# Patient Record
Sex: Female | Born: 1965
Health system: Southern US, Community
[De-identification: ages and names within clinical notes are randomized; demographics above are authoritative.]

## PROBLEM LIST (undated history)

## (undated) DIAGNOSIS — E079 Disorder of thyroid, unspecified: Secondary | ICD-10-CM

## (undated) DIAGNOSIS — Z8739 Personal history of other diseases of the musculoskeletal system and connective tissue: Secondary | ICD-10-CM

## (undated) DIAGNOSIS — Z923 Personal history of irradiation: Secondary | ICD-10-CM

## (undated) DIAGNOSIS — C4490 Unspecified malignant neoplasm of skin, unspecified: Secondary | ICD-10-CM

## (undated) DIAGNOSIS — M1990 Unspecified osteoarthritis, unspecified site: Secondary | ICD-10-CM

## (undated) DIAGNOSIS — C801 Malignant (primary) neoplasm, unspecified: Secondary | ICD-10-CM

## (undated) DIAGNOSIS — Z1379 Encounter for other screening for genetic and chromosomal anomalies: Secondary | ICD-10-CM

## (undated) DIAGNOSIS — C50919 Malignant neoplasm of unspecified site of unspecified female breast: Secondary | ICD-10-CM

## (undated) DIAGNOSIS — W19XXXA Unspecified fall, initial encounter: Secondary | ICD-10-CM

## (undated) HISTORY — DX: Disorder of thyroid, unspecified: E07.9

## (undated) HISTORY — DX: Encounter for other screening for genetic and chromosomal anomalies: Z13.79

## (undated) HISTORY — DX: Personal history of other diseases of the musculoskeletal system and connective tissue: Z87.39

## (undated) HISTORY — PX: SALPINGOOPHORECTOMY: SHX82

## (undated) HISTORY — DX: Unspecified fall, initial encounter: W19.XXXA

## (undated) HISTORY — DX: Unspecified malignant neoplasm of skin, unspecified: C44.90

## (undated) HISTORY — DX: Unspecified osteoarthritis, unspecified site: M19.90

## (undated) HISTORY — DX: Malignant (primary) neoplasm, unspecified: C80.1

---

## 1998-05-24 HISTORY — PX: CYST REMOVAL LEG: SHX6280

## 2005-12-09 ENCOUNTER — Ambulatory Visit (HOSPITAL_COMMUNITY): Admission: RE | Admit: 2005-12-09 | Discharge: 2005-12-09 | Payer: Self-pay | Admitting: Obstetrics

## 2009-07-29 ENCOUNTER — Ambulatory Visit: Payer: Self-pay | Admitting: Internal Medicine

## 2009-12-02 ENCOUNTER — Ambulatory Visit: Payer: Self-pay | Admitting: Internal Medicine

## 2010-06-14 ENCOUNTER — Encounter: Payer: Self-pay | Admitting: Internal Medicine

## 2011-01-06 ENCOUNTER — Ambulatory Visit: Payer: Self-pay | Admitting: Internal Medicine

## 2013-05-24 DIAGNOSIS — C50919 Malignant neoplasm of unspecified site of unspecified female breast: Secondary | ICD-10-CM

## 2013-05-24 HISTORY — DX: Malignant neoplasm of unspecified site of unspecified female breast: C50.919

## 2013-05-24 HISTORY — PX: BREAST BIOPSY: SHX20

## 2013-05-24 HISTORY — PX: BREAST LUMPECTOMY: SHX2

## 2013-08-22 DIAGNOSIS — C801 Malignant (primary) neoplasm, unspecified: Secondary | ICD-10-CM

## 2013-08-22 HISTORY — DX: Malignant (primary) neoplasm, unspecified: C80.1

## 2013-09-12 ENCOUNTER — Ambulatory Visit: Payer: Self-pay | Admitting: Unknown Physician Specialty

## 2013-09-17 ENCOUNTER — Other Ambulatory Visit: Payer: Self-pay | Admitting: General Surgery

## 2013-09-17 ENCOUNTER — Ambulatory Visit: Payer: Self-pay | Admitting: General Surgery

## 2013-09-17 ENCOUNTER — Ambulatory Visit (INDEPENDENT_AMBULATORY_CARE_PROVIDER_SITE_OTHER): Payer: TRICARE For Life (TFL) | Admitting: General Surgery

## 2013-09-17 ENCOUNTER — Other Ambulatory Visit: Payer: TRICARE For Life (TFL)

## 2013-09-17 ENCOUNTER — Encounter: Payer: Self-pay | Admitting: General Surgery

## 2013-09-17 VITALS — BP 110/74 | HR 76 | Resp 14 | Ht 72.0 in | Wt 170.0 lb

## 2013-09-17 DIAGNOSIS — R920 Mammographic microcalcification found on diagnostic imaging of breast: Secondary | ICD-10-CM | POA: Insufficient documentation

## 2013-09-17 DIAGNOSIS — N63 Unspecified lump in unspecified breast: Secondary | ICD-10-CM | POA: Insufficient documentation

## 2013-09-17 HISTORY — PX: BREAST SURGERY: SHX581

## 2013-09-17 NOTE — Progress Notes (Signed)
Patient ID: Susan Singleton, female   DOB: 1965-05-30, 48 y.o.   MRN: [id number]  Chief Complaint  Patient presents with  . Other    mammogram    HPI Susan Singleton is a 48 y.o. female. who presents for a breast evaluation. The most recent mammogram was done on 09/12/13 as well as a left breast ultrasound. Patient does not perform regular self breast checks. Her last mammogram prior to this one was done in 2013. She denies any problems with the breasts at this time. No known family history of breast problems. The patient is unaware of any trauma to the breast.  She is a native of [place].  The patient is accompanied by her daughter, Susan Singleton,  her only child, who was present for the interview and exam.  HPI  Past Medical History  Diagnosis Date  . Thyroid disease   . Arthritis   . H/O fibromyalgia     Past Surgical History  Procedure Laterality Date  . Cyst removal leg Right 2000  . Ovarian cyst surgery  2004    Family History  Problem Relation Age of Onset  . Cancer Mother     bladder and skin    Social History History  Substance Use Topics  . Smoking status: Former Smoker -- 1.00 packs/day for 20 years    Types: Cigarettes  . Smokeless tobacco: Never Used  . Alcohol Use: Yes    No Known Allergies  Current Outpatient Prescriptions  Medication Sig Dispense Refill  . folic acid (FOLVITE) 1 MG tablet Take 1 mg by mouth daily.      . meloxicam (MOBIC) 7.5 MG tablet Take 7.5 mg by mouth daily.      . methotrexate (RHEUMATREX) 2.5 MG tablet Take 2.5 mg by mouth once a week. Caution:Chemotherapy. Protect from light.      . predniSONE (STERAPRED UNI-PAK) 5 MG TABS tablet Take 1 mg by mouth daily.      . traMADol (ULTRAM) 50 MG tablet Take by mouth every 6 (six) hours as needed.      . vitamin E 100 UNIT capsule Take by mouth daily.      . Vitamins A & D (VITAMIN A & D) ointment Apply 1 application topically as needed for dry skin.       No current  facility-administered medications for this visit.    Review of Systems Review of Systems  Constitutional: Negative.   Respiratory: Negative.   Cardiovascular: Negative.     Blood pressure 110/74, pulse 76, resp. rate 14, height 6' (1.829 m), weight 170 lb (77.111 kg), last menstrual period 05/09/2013.  Physical Exam Physical Exam  Constitutional: She is oriented to person, place, and time. She appears well-developed and well-nourished.  Neck: Neck supple. No thyromegaly present.  Cardiovascular: Normal rate, regular rhythm and normal heart sounds.   No murmur heard. Pulmonary/Chest: Effort normal and breath sounds normal. Right breast exhibits no inverted nipple, no mass, no nipple discharge, no skin change and no tenderness. Left breast exhibits no inverted nipple, no mass, no nipple discharge, no skin change and no tenderness.    Thickening at 7 o'clock of left breast.   Lymphadenopathy:    She has no cervical adenopathy.    She has no axillary adenopathy.  Neurological: She is alert and oriented to person, place, and time.  Skin: Skin is warm and dry.    Data Reviewed Screening mammogram completed at her GYN office (August 29, 2013)showed area of calcifications  in the lower quadrant of left breast. Focal spot compression views and ultrasound dated September 12, 2013 showed a 1.0 x 1.7 x 2.30 cm area of pleomorphic calcifications in the lower outer quadrant of the breast. Ultrasound suggested an irregular hypoechoic mass 3 cm from the nipple at the 7 o'clock position measuring 1.5 x 1.6 x 1.7 cm. A normal left axilla was identified. BI-RAD-5. All these images were reviewed. GYN notes of August 29, 2013 were reviewed.  Ultrasound examination of the left breast confirmed a area of heterogeneous area with microcalcifications and acoustic shadowing in the 7 o'clock position the left breast, 2 cm of the nipple. On today's exam this measured 0.7 x 1.0 x 1.4 cm. The patient was amenable to core  biopsy.  10 cc of 0.5% Xylocaine with 0.25% Marcaine with 1:200,000 units of epinephrine was utilized well-tolerated. Approximately 10 core samples were obtained without discomfort making use of a 9-gauge Encor device. A postbiopsy clip was placed. Scant bleeding was noted. The skin defect was closed with benzoin and Steri-Strips followed by Telfa Tegaderm dressing.   Her instructions for wound care were provided.   Assessment    The left breast is suspicious for malignancy.     Plan    The patient will be contacted when her pathology is available.     Ref. MD: Dr. Vernie Ammons   PCP: None   [name] Knee 09/17/2013, 8:29 PM

## 2013-09-17 NOTE — Patient Instructions (Signed)

## 2013-09-20 ENCOUNTER — Telehealth: Payer: Self-pay | Admitting: *Deleted

## 2013-09-20 NOTE — Telephone Encounter (Signed)
I spoke with the patient and she is aware that the [facility] maybe calling to offer an appointment with the medical oncologist (she wants to wait until she talks to Dr. Bary Castilla). She will also be thinking/considering genetic testing before her appt with Dr Bary Castilla on Wednesday. Appreciates phone call.

## 2013-09-24 ENCOUNTER — Ambulatory Visit: Payer: TRICARE For Life (TFL)

## 2013-09-24 DIAGNOSIS — W19XXXA Unspecified fall, initial encounter: Secondary | ICD-10-CM

## 2013-09-24 HISTORY — DX: Unspecified fall, initial encounter: W19.XXXA

## 2013-09-26 ENCOUNTER — Encounter: Payer: Self-pay | Admitting: General Surgery

## 2013-09-26 ENCOUNTER — Ambulatory Visit (INDEPENDENT_AMBULATORY_CARE_PROVIDER_SITE_OTHER): Payer: TRICARE For Life (TFL) | Admitting: General Surgery

## 2013-09-26 VITALS — BP 140/80 | HR 72 | Resp 12 | Ht 72.0 in | Wt 172.0 lb

## 2013-09-26 DIAGNOSIS — C50319 Malignant neoplasm of lower-inner quadrant of unspecified female breast: Secondary | ICD-10-CM

## 2013-09-26 LAB — PATHOLOGY

## 2013-09-26 NOTE — Progress Notes (Signed)
Patient ID: Susan Singleton, female   DOB: 01-09-1966, 48 y.o.   MRN: [id number]  Chief Complaint  Patient presents with  . Routine Post Op    HPI Susan Singleton is a 48 y.o. female.  Here today for post op left breast biopsy 09-17-13 and discussion. States she fell 09-24-13, bruising to right shoulder, knee and thigh. The patient is accompanied by her daughter, the tourniquet was present for the interview and exam. She reports extensive bruising which is improving since biopsy. Minimal pain, only some itching. The patient discontinued her Embrel 3 weeks ago and has noted increasing fatigue. She was encouraged to resume her usual dose today, and we'll plan on discontinuing this one week prior to any surgical intervention.  HPI  Past Medical History  Diagnosis Date  . Thyroid disease   . Arthritis   . H/O fibromyalgia   . Fall 09-24-13  . Cancer April 2015    2 mm ER neg, PR neg, Her 2 neu amplified cancer with extensive DCIS    Past Surgical History  Procedure Laterality Date  . Cyst removal leg Right 2000  . Ovarian cyst surgery  2004  . Breast surgery Left 09-17-13    INVASIVE MAMMARY CARCINOMA OF NO SPECIAL TYPE,/DCIS     Family History  Problem Relation Age of Onset  . Cancer Mother     bladder and skin    Social History History  Substance Use Topics  . Smoking status: Former Smoker -- 1.00 packs/day for 20 years    Types: Cigarettes  . Smokeless tobacco: Never Used  . Alcohol Use: Yes    No Known Allergies  Current Outpatient Prescriptions  Medication Sig Dispense Refill  . folic acid (FOLVITE) 1 MG tablet Take 1 mg by mouth daily.      . meloxicam (MOBIC) 7.5 MG tablet Take 7.5 mg by mouth daily.      . methotrexate (RHEUMATREX) 2.5 MG tablet Take 2.5 mg by mouth once a week. Caution:Chemotherapy. Protect from light.      . predniSONE (STERAPRED UNI-PAK) 5 MG TABS tablet Take 1 mg by mouth daily.      . traMADol (ULTRAM) 50 MG tablet Take by mouth every 6 (six) hours  as needed.      . vitamin E 100 UNIT capsule Take by mouth daily.      . Vitamins A & D (VITAMIN A & D) ointment Apply 1 application topically as needed for dry skin.       No current facility-administered medications for this visit.    Review of Systems Review of Systems  Constitutional: Negative.   Respiratory: Negative.   Cardiovascular: Negative.     Blood pressure 140/80, pulse 72, resp. rate 12, height 6' (1.829 m), weight 172 lb (78.019 kg), last menstrual period 05/09/2013.  Physical Exam Physical Exam  Constitutional: She is oriented to person, place, and time. She appears well-developed and well-nourished.  Neck: Neck supple.  Cardiovascular: Normal rate, regular rhythm and normal heart sounds.   Pulmonary/Chest: Effort normal and breath sounds normal.  Bruising to left breast noted.  Lymphadenopathy:    She has no cervical adenopathy.  Neurological: She is alert and oriented to person, place, and time.  Skin: Skin is warm and dry.    Data Reviewed Pathology showed a 2 mm foci of invasive cancer as well as extensive DCIS, correlating with a mammographic abnormality. Unavailable at the time of the patient's appointment to do delay in transfer from  the laboratory computer to the MR, were the patient's ER/PR and HER-2/neu studies.  Assessment    Stage I carcinoma of left breast.     Plan    Options for management were reviewed. Breast conservation and mastectomy were presented as equivalent procedures. The pros and cons of surgical intervention including the need for adjuvant radiation therapy she breast conservation be chosen was discussed. The patient has a generous breast volume ( "C" cup), but the area of involvement is within 2-3 cm of the nipple, which may change the contour and sensitivity of the area. We had long discussion about the "what if" clear margins could not be obtained. She was certainly will want breast Reconstruction of mastectomy was needed, but is  not adverse to a two-step procedure as breast conservation is unsuccessful and mastectomy is required. The option for genetic testing was discussed. She is under 50, and would qualify under most insurance plans for coverage. There is no family history for breast cancer, and her age is her primary risk. After her visit it was determined that she had an ER/PR-negative tumor, but she does not meet criteria as she is HER-2 positive.  An informational brochure was provided. Opportunity for second surgical opinion, medical oncology assessment for radiation oncology assessment were reviewed. Potential benefits of early plastic surgery evaluation was discussed. She lives in [place], the opportunity to have her surgery/reconstruction completed in [place] was discussed. At this time she is comfortable being followed here in [place] and is amenable to see the [facility] based Psychiatric nurse if needed.  The patient has an appointment with her rheumatologist, Dr. Bronson Curb this coming Monday. She wants to meet with her rheumatologist prior to make any final decisions.  She'll be contacted after her case is reviewed with medical oncology. I don't think she would be a candidate for neoadjuvant chemotherapy unless completed under a protocol.  We spent approximately 1 hour at the time of today's visit, the majority related to options for management.          Forest Gleason Kionte Baumgardner 09/26/2013, 9:09 PM

## 2013-09-27 LAB — HER-2 / NEU, FISH
AVG NUM CEP17 PROBES/NUCLEUS: 1.8
Avg Num Her-2 signals/nucleus:: 12.8
HER-2/CEP17 Ratio: 7.29
NUMBER OF TUMOR CELLS COUNTED: 40
Number of Observers:: 2

## 2013-09-27 LAB — ER/PR,IMMUNOHISTOCHEM,PARAFFIN
Estrogen Receptor IHC: 0 %
PROGESTERONE RECP IP: 0 %

## 2013-10-01 ENCOUNTER — Telehealth: Payer: Self-pay | Admitting: General Surgery

## 2013-10-01 NOTE — Telephone Encounter (Signed)
PATIENT CALLED TO LET DR Bary Castilla KNOW SHE HAD DECIDED TO HAVE A LUMPECTOMY W/ RADIATION AND GENETIC TESTING.HE HAS BEEN NOTIFIED.SHE WOULD LIKE FOR YOU TO CALL HER.

## 2013-10-01 NOTE — Telephone Encounter (Signed)
10-01-13 PATIENT CALLED AND HAS DECIDED TO HAVE A LUMPECTOMY W/ RADIATION AND GENETIC TESTING.SHE WANTED TO KNOW WHERE TO GO FROM HERE.

## 2013-10-01 NOTE — Telephone Encounter (Signed)
Will arrange for  BRCA testing at a minimum based on her age of diagnosis. Will work on scheduling wide excision and SLN biopsy when convenient.  Susan Singleton: Please arrange for BRCA testing.

## 2013-10-02 ENCOUNTER — Other Ambulatory Visit: Payer: Self-pay | Admitting: *Deleted

## 2013-10-02 ENCOUNTER — Telehealth: Payer: Self-pay

## 2013-10-02 NOTE — Telephone Encounter (Signed)
Discussed BRCA testing to be completed, pt agrees. Age at [place] 38. She will check into family history of breast/ovarian cancer as well.

## 2013-10-02 NOTE — Telephone Encounter (Signed)
Spoke with patient about surgery. Patient has been scheduled for surgery at [facility] on 10/18/13. She will pre admit by phone on 10/08/13. Patient is on Enbrel injections and per Rheumatologists's recommendations is stopping her Enbrel today. Dr. Dossie Der [facility] to fax instruction letter to Dr Bary Castilla about this. Patient is aware of date and all instructions. Surgery instruction sheet has been mailed to the patient.

## 2013-10-04 ENCOUNTER — Ambulatory Visit (INDEPENDENT_AMBULATORY_CARE_PROVIDER_SITE_OTHER): Payer: TRICARE For Life (TFL) | Admitting: *Deleted

## 2013-10-04 ENCOUNTER — Encounter: Payer: Self-pay | Admitting: *Deleted

## 2013-10-04 DIAGNOSIS — Z1371 Encounter for nonprocreative screening for genetic disease carrier status: Secondary | ICD-10-CM

## 2013-10-04 DIAGNOSIS — C50319 Malignant neoplasm of lower-inner quadrant of unspecified female breast: Secondary | ICD-10-CM

## 2013-10-04 HISTORY — DX: Encounter for nonprocreative screening for genetic disease carrier status: Z13.71

## 2013-10-04 NOTE — Progress Notes (Signed)
Genetic test completed BRCA.

## 2013-10-04 NOTE — Patient Instructions (Signed)
As scheduled

## 2013-10-07 ENCOUNTER — Other Ambulatory Visit: Payer: Self-pay | Admitting: General Surgery

## 2013-10-07 ENCOUNTER — Telehealth: Payer: Self-pay | Admitting: General Surgery

## 2013-10-07 DIAGNOSIS — C50319 Malignant neoplasm of lower-inner quadrant of unspecified female breast: Secondary | ICD-10-CM

## 2013-10-07 NOTE — Telephone Encounter (Signed)
Reviewed plans for upcoming breast conservation surgery. Indications for SLN biopsy, difficulty of identifying margins w/ DCIS reviewed. Role for med onc involvement after all data available to address the 2 mm area of ER/PR neg,  Her2_+ tumor reviewed.  Post surgery radiation discussed.  Ready for May 28 OR date.

## 2013-10-18 ENCOUNTER — Ambulatory Visit: Payer: Self-pay | Admitting: General Surgery

## 2013-10-18 ENCOUNTER — Encounter: Payer: Self-pay | Admitting: General Surgery

## 2013-10-18 DIAGNOSIS — C50319 Malignant neoplasm of lower-inner quadrant of unspecified female breast: Secondary | ICD-10-CM

## 2013-10-18 HISTORY — PX: BREAST SURGERY: SHX581

## 2013-10-19 ENCOUNTER — Encounter: Payer: Self-pay | Admitting: General Surgery

## 2013-10-22 ENCOUNTER — Telehealth: Payer: Self-pay | Admitting: General Surgery

## 2013-10-22 LAB — PATHOLOGY REPORT

## 2013-10-22 NOTE — Telephone Encounter (Signed)
Notified path results were fine: Negative margins, no residual invasive cancer, negative SLN.

## 2013-10-23 ENCOUNTER — Encounter: Payer: Self-pay | Admitting: General Surgery

## 2013-10-23 ENCOUNTER — Ambulatory Visit (INDEPENDENT_AMBULATORY_CARE_PROVIDER_SITE_OTHER): Payer: TRICARE For Life (TFL) | Admitting: General Surgery

## 2013-10-23 VITALS — BP 118/80 | HR 80 | Resp 14 | Ht 72.0 in | Wt 171.0 lb

## 2013-10-23 DIAGNOSIS — C50319 Malignant neoplasm of lower-inner quadrant of unspecified female breast: Secondary | ICD-10-CM

## 2013-10-23 NOTE — Patient Instructions (Addendum)
Patient to been taking to the  Medical oncologist.  Patient is scheduled to see Dr Noreene Filbert on 10/26/13 at 11:00 am for consultation. Patient is aware of date and time.

## 2013-10-23 NOTE — Progress Notes (Signed)
Patient ID: Susan Singleton, female   DOB: February 29, 1966, 48 y.o.   MRN: [id number]  Chief Complaint  Patient presents with  . Routine Post Op    left breast excision    HPI Susan Singleton is a 48 y.o. female. here today for her post op left breast excision done on 10/18/13. Patient states she is doing well. Patient states she has a broken arm found out on 10-19-13. The patient has experienced minimal pain since surgery. She was glad to have the compressive wrap off earlier this week.  HPI  Past Medical History  Diagnosis Date  . Thyroid disease   . Arthritis   . H/O fibromyalgia   . Fall 09-24-13  . Cancer April 2015    2 mm ER neg, PR neg, Her 2 neu amplified cancer with extensive DCIS, T1a,N0,M0.    Past Surgical History  Procedure Laterality Date  . Cyst removal leg Right 2000  . Ovarian cyst surgery  2004  . Breast surgery Left 09-17-13    INVASIVE MAMMARY CARCINOMA OF NO SPECIAL TYPE,/DCIS   . Breast surgery Left 10/18/13    Wide excision, mastoplasty. Sentinel node biopsy.    Family History  Problem Relation Age of Onset  . Cancer Mother 39    bladder and skin    Social History History  Substance Use Topics  . Smoking status: Former Smoker -- 1.00 packs/day for 20 years    Types: Cigarettes  . Smokeless tobacco: Never Used  . Alcohol Use: Yes    No Known Allergies  Current Outpatient Prescriptions  Medication Sig Dispense Refill  . etanercept (ENBREL) 25 MG injection Inject 50 mg into the skin once a week.      . folic acid (FOLVITE) 1 MG tablet Take 1 mg by mouth daily.      . meloxicam (MOBIC) 7.5 MG tablet Take 7.5 mg by mouth daily.      . methotrexate (RHEUMATREX) 2.5 MG tablet Take 2.5 mg by mouth once a week. Caution:Chemotherapy. Protect from light.      . predniSONE (STERAPRED UNI-PAK) 5 MG TABS tablet Take 1 mg by mouth daily.      . traMADol (ULTRAM) 50 MG tablet Take by mouth every 6 (six) hours as needed.      . vitamin E 100 UNIT capsule Take by mouth  daily.      . Vitamins A & D (VITAMIN A & D) ointment Apply 1 application topically as needed for dry skin.       No current facility-administered medications for this visit.    Review of Systems Review of Systems  Constitutional: Negative.   Respiratory: Negative.     Blood pressure 118/80, pulse 80, resp. rate 14, height 6' (1.829 m), weight 171 lb (77.565 kg).  Physical Exam Physical Exam  Constitutional: She is oriented to person, place, and time. She appears well-developed and well-nourished.  Eyes: Conjunctivae are normal.  Neck: Neck supple.  Pulmonary/Chest:  Left breast incision looks clean and healing well.   Neurological: She is alert and oriented to person, place, and time.  Skin: Skin is warm and dry.    Data Reviewed Pathology showed no residual invasive cancer. The DCIS did come to within less than a millimeter of the cranial margin.  Assessment    T1a, N0, carcinoma of the left breast. ER/PR negative, HER-2/neu overexpression.     Plan    The patient has a very small HER-2/neu positive tumor. Input for medical  oncology will be obtained. Radiation oncology appointment has been scheduled for later this week. I think that she will be best served by whole breast radiation.  At this time she'll resume her methotrexate weekly dosing beginning on October 24, 2013. She will continue off of her Enbrel until instructed otherwise by her medical oncologist. She'll wean off the Vicodin and resume her normal dose of Ultram for control of her chronic fibromyalgia.     Patient is scheduled to see Dr Noreene Filbert on 10/26/13 at 11:00 am for consultation. Patient is aware of date and time.   PCP: No Pcp Per Patient Ref. MD: Dr. Ricka Burdock 10/23/2013, 8:28 PM

## 2013-10-25 ENCOUNTER — Telehealth: Payer: Self-pay | Admitting: *Deleted

## 2013-10-25 NOTE — Telephone Encounter (Signed)
Message copied by Carson Myrtle on Thu Oct 25, 2013  5:17 PM ------      Message from: [place], Forest Gleason      Created: Thu Oct 25, 2013  3:27 PM       Please notify the patient that I have no additional information at present.  Our weekly meeting where I planned to present her case was cancelled secondary to the JCAHO inspection. Will keep her informed when the meeting is rescheduled. Thanks. ------

## 2013-10-25 NOTE — Telephone Encounter (Signed)
Notified patient as instructed, patient pleased. Discussed follow-up appointments, patient agrees  

## 2013-10-26 ENCOUNTER — Ambulatory Visit: Payer: Self-pay | Admitting: Radiation Oncology

## 2013-11-06 ENCOUNTER — Encounter: Payer: Self-pay | Admitting: General Surgery

## 2013-11-06 ENCOUNTER — Ambulatory Visit (INDEPENDENT_AMBULATORY_CARE_PROVIDER_SITE_OTHER): Payer: TRICARE For Life (TFL) | Admitting: General Surgery

## 2013-11-06 VITALS — BP 122/74 | HR 76 | Resp 12 | Ht 72.0 in | Wt 170.0 lb

## 2013-11-06 DIAGNOSIS — C50319 Malignant neoplasm of lower-inner quadrant of unspecified female breast: Secondary | ICD-10-CM

## 2013-11-06 NOTE — Patient Instructions (Addendum)
Patient to return in 2 months. 

## 2013-11-06 NOTE — Progress Notes (Signed)
Patient ID: Susan Singleton, female   DOB: Jan 22, 1966, 48 y.o.   MRN: [id number]  Chief Complaint  Patient presents with  . Routine Post Op    left breast wide excision    HPI Susan Singleton is a 48 y.o. female here today for her post op left breast excision done on 10/18/13. Patient states she is doing well.                   HPI  Past Medical History  Diagnosis Date  . Thyroid disease   . Arthritis   . H/O fibromyalgia   . Fall 09-24-13  . Cancer April 2015    2 mm ER neg, PR neg, Her 2 neu amplified cancer with extensive DCIS, T1a,N0,M0.    Past Surgical History  Procedure Laterality Date  . Cyst removal leg Right 2000  . Ovarian cyst surgery  2004  . Breast surgery Left 09-17-13    INVASIVE MAMMARY CARCINOMA OF NO SPECIAL TYPE,/DCIS   . Breast surgery Left 10/18/13    Wide excision, mastoplasty. Sentinel node biopsy.    Family History  Problem Relation Age of Onset  . Cancer Mother 63    bladder and skin    Social History History  Substance Use Topics  . Smoking status: Former Smoker -- 1.00 packs/day for 20 years    Types: Cigarettes  . Smokeless tobacco: Never Used  . Alcohol Use: Yes    No Known Allergies  Current Outpatient Prescriptions  Medication Sig Dispense Refill  . etanercept (ENBREL) 25 MG injection Inject 50 mg into the skin once a week.      . folic acid (FOLVITE) 1 MG tablet Take 1 mg by mouth daily.      . meloxicam (MOBIC) 7.5 MG tablet Take 7.5 mg by mouth daily.      . methotrexate (RHEUMATREX) 2.5 MG tablet Take 2.5 mg by mouth once a week. Caution:Chemotherapy. Protect from light.      . predniSONE (STERAPRED UNI-PAK) 5 MG TABS tablet Take 1 mg by mouth daily.      . traMADol (ULTRAM) 50 MG tablet Take by mouth every 6 (six) hours as needed.      . vitamin E 100 UNIT capsule Take by mouth daily.      . Vitamins A & D (VITAMIN A & D) ointment Apply 1 application topically as needed for dry skin.       No current facility-administered  medications for this visit.    Review of Systems Review of Systems  Constitutional: Negative.   Respiratory: Negative.   Cardiovascular: Negative.     Blood pressure 122/74, pulse 76, resp. rate 12, height 6' (1.829 m), weight 170 lb (77.111 kg).  Physical Exam Physical Exam  Constitutional: She is oriented to person, place, and time. She appears well-developed and well-nourished.  Eyes: Conjunctivae are normal. No scleral icterus.  Neck: Neck supple.  Cardiovascular: Normal rate, regular rhythm and normal heart sounds.   Pulmonary/Chest: Effort normal and breath sounds normal. Right breast exhibits no inverted nipple, no mass, no nipple discharge, no skin change and no tenderness. Left breast exhibits no inverted nipple, no mass, no nipple discharge, no skin change and no tenderness.  Left lower quadrant and axillary is healing well. Incision look clean .  Abdominal: Bowel sounds are normal.  Neurological: She is alert and oriented to person, place, and time.  Skin: Skin is warm and dry.    Data Reviewed BRCA test  results still pending.  Assessment    Doing well status post wide excision and sentinel node biopsy.     Plan    The patient's case was reviewed at the [facility] Tumor board yesterday. No indication for adjuvant chemotherapy. Whole breast radiation plan due to the extensive DCIS and the close (less than 1 cm) superior margin.  We'll plan for a follow up examination in 2 months.     PCP: No Pcp Per Patient  Ref. MD: Dr. Percell Boston, Forest Gleason 11/06/2013, 10:05 PM

## 2013-11-07 ENCOUNTER — Telehealth: Payer: Self-pay | Admitting: *Deleted

## 2013-11-07 ENCOUNTER — Encounter: Payer: Self-pay | Admitting: *Deleted

## 2013-11-07 NOTE — Telephone Encounter (Signed)
Please let the patient know her genetic testing was negative per Dr. Bary Castilla, she should receive her copy in mail. Also, let her know that Dr. Oliva Bustard will be talking with her physician about the Enbrel injections and will be calling her. She said they called her today, she has appt with Dr Baruch Gouty this afternoon and said they would let her know then when to start the injections back. She is pleased that the genetic testing was negative.

## 2013-11-07 NOTE — Telephone Encounter (Signed)
Message copied by Carson Myrtle on Wed Nov 07, 2013 11:37 AM ------      Message from: Robert Bellow      Created: Tue Nov 06, 2013 10:04 PM       See if you can track  down the BRCA test results. It has been a month. ------

## 2013-11-07 NOTE — Telephone Encounter (Signed)
Per Loui at [facility] he states results were mailed 11-18-13, he will fax results as well.

## 2013-11-13 ENCOUNTER — Encounter: Payer: Self-pay | Admitting: General Surgery

## 2013-11-15 LAB — CBC CANCER CENTER
Basophil #: 0.1 x10 3/mm (ref 0.0–0.1)
Basophil %: 1.2 %
EOS ABS: 0 x10 3/mm (ref 0.0–0.7)
Eosinophil %: 0.7 %
HCT: 43.3 % (ref 35.0–47.0)
HGB: 14.7 g/dL (ref 12.0–16.0)
Lymphocyte #: 1.3 x10 3/mm (ref 1.0–3.6)
Lymphocyte %: 21.5 %
MCH: 34.4 pg — ABNORMAL HIGH (ref 26.0–34.0)
MCHC: 33.8 g/dL (ref 32.0–36.0)
MCV: 102 fL — ABNORMAL HIGH (ref 80–100)
MONOS PCT: 12.6 %
Monocyte #: 0.8 x10 3/mm (ref 0.2–0.9)
NEUTROS ABS: 3.9 x10 3/mm (ref 1.4–6.5)
Neutrophil %: 64 %
Platelet: 202 x10 3/mm (ref 150–440)
RBC: 4.26 10*6/uL (ref 3.80–5.20)
RDW: 13.1 % (ref 11.5–14.5)
WBC: 6.1 x10 3/mm (ref 3.6–11.0)

## 2013-11-21 ENCOUNTER — Ambulatory Visit: Payer: Self-pay | Admitting: Radiation Oncology

## 2013-11-22 ENCOUNTER — Ambulatory Visit: Payer: Self-pay | Admitting: Rheumatology

## 2013-11-22 LAB — CBC CANCER CENTER
Basophil #: 0.1 x10 3/mm (ref 0.0–0.1)
Basophil %: 1.9 %
EOS PCT: 1.5 %
Eosinophil #: 0.1 x10 3/mm (ref 0.0–0.7)
HCT: 43.5 % (ref 35.0–47.0)
HGB: 14.5 g/dL (ref 12.0–16.0)
LYMPHS ABS: 1.5 x10 3/mm (ref 1.0–3.6)
Lymphocyte %: 25.8 %
MCH: 33.8 pg (ref 26.0–34.0)
MCHC: 33.4 g/dL (ref 32.0–36.0)
MCV: 101 fL — AB (ref 80–100)
MONO ABS: 0.7 x10 3/mm (ref 0.2–0.9)
MONOS PCT: 12.5 %
Neutrophil #: 3.5 x10 3/mm (ref 1.4–6.5)
Neutrophil %: 58.3 %
PLATELETS: 210 x10 3/mm (ref 150–440)
RBC: 4.3 10*6/uL (ref 3.80–5.20)
RDW: 12.6 % (ref 11.5–14.5)
WBC: 5.9 x10 3/mm (ref 3.6–11.0)

## 2013-11-22 LAB — COMPREHENSIVE METABOLIC PANEL
ANION GAP: 5 — AB (ref 7–16)
AST: 24 U/L (ref 15–37)
Albumin: 3.7 g/dL (ref 3.4–5.0)
Alkaline Phosphatase: 82 U/L
BUN: 19 mg/dL — AB (ref 7–18)
Bilirubin,Total: 1.3 mg/dL — ABNORMAL HIGH (ref 0.2–1.0)
CALCIUM: 8.7 mg/dL (ref 8.5–10.1)
CHLORIDE: 103 mmol/L (ref 98–107)
Co2: 31 mmol/L (ref 21–32)
Creatinine: 0.64 mg/dL (ref 0.60–1.30)
Glucose: 81 mg/dL (ref 65–99)
Osmolality: 279 (ref 275–301)
Potassium: 4.2 mmol/L (ref 3.5–5.1)
SGPT (ALT): 22 U/L (ref 12–78)
Sodium: 139 mmol/L (ref 136–145)
Total Protein: 7.6 g/dL (ref 6.4–8.2)

## 2013-11-30 LAB — CBC CANCER CENTER
Basophil #: 0.1 x10 3/mm (ref 0.0–0.1)
Basophil %: 1.2 %
EOS ABS: 0.1 x10 3/mm (ref 0.0–0.7)
EOS PCT: 1.1 %
HCT: 46.5 % (ref 35.0–47.0)
HGB: 15.8 g/dL (ref 12.0–16.0)
Lymphocyte #: 1.7 x10 3/mm (ref 1.0–3.6)
Lymphocyte %: 22.2 %
MCH: 34.2 pg — ABNORMAL HIGH (ref 26.0–34.0)
MCHC: 34 g/dL (ref 32.0–36.0)
MCV: 101 fL — ABNORMAL HIGH (ref 80–100)
MONO ABS: 0.7 x10 3/mm (ref 0.2–0.9)
MONOS PCT: 8.8 %
NEUTROS PCT: 66.7 %
Neutrophil #: 5 x10 3/mm (ref 1.4–6.5)
PLATELETS: 192 x10 3/mm (ref 150–440)
RBC: 4.62 10*6/uL (ref 3.80–5.20)
RDW: 12.5 % (ref 11.5–14.5)
WBC: 7.5 x10 3/mm (ref 3.6–11.0)

## 2013-12-06 LAB — CBC CANCER CENTER
BASOS ABS: 0 x10 3/mm (ref 0.0–0.1)
BASOS PCT: 0.2 %
EOS ABS: 0.1 x10 3/mm (ref 0.0–0.7)
Eosinophil %: 1.4 %
HCT: 43.3 % (ref 35.0–47.0)
HGB: 14.7 g/dL (ref 12.0–16.0)
LYMPHS PCT: 20.3 %
Lymphocyte #: 1.3 x10 3/mm (ref 1.0–3.6)
MCH: 34.2 pg — ABNORMAL HIGH (ref 26.0–34.0)
MCHC: 34 g/dL (ref 32.0–36.0)
MCV: 101 fL — ABNORMAL HIGH (ref 80–100)
MONOS PCT: 12.4 %
Monocyte #: 0.8 x10 3/mm (ref 0.2–0.9)
NEUTROS PCT: 65.7 %
Neutrophil #: 4.3 x10 3/mm (ref 1.4–6.5)
PLATELETS: 184 x10 3/mm (ref 150–440)
RBC: 4.3 10*6/uL (ref 3.80–5.20)
RDW: 12.3 % (ref 11.5–14.5)
WBC: 6.5 x10 3/mm (ref 3.6–11.0)

## 2013-12-13 LAB — CBC CANCER CENTER
BASOS ABS: 0.1 x10 3/mm (ref 0.0–0.1)
Basophil %: 1.3 %
EOS PCT: 1.7 %
Eosinophil #: 0.1 x10 3/mm (ref 0.0–0.7)
HCT: 41.7 % (ref 35.0–47.0)
HGB: 14.2 g/dL (ref 12.0–16.0)
LYMPHS ABS: 1.2 x10 3/mm (ref 1.0–3.6)
Lymphocyte %: 19.5 %
MCH: 34.3 pg — AB (ref 26.0–34.0)
MCHC: 34.1 g/dL (ref 32.0–36.0)
MCV: 101 fL — ABNORMAL HIGH (ref 80–100)
Monocyte #: 0.7 x10 3/mm (ref 0.2–0.9)
Monocyte %: 11.7 %
Neutrophil #: 3.9 x10 3/mm (ref 1.4–6.5)
Neutrophil %: 65.8 %
Platelet: 187 x10 3/mm (ref 150–440)
RBC: 4.14 10*6/uL (ref 3.80–5.20)
RDW: 12.6 % (ref 11.5–14.5)
WBC: 5.9 x10 3/mm (ref 3.6–11.0)

## 2013-12-20 LAB — CBC CANCER CENTER
BASOS PCT: 1.4 %
Basophil #: 0.1 x10 3/mm (ref 0.0–0.1)
Eosinophil #: 0.1 x10 3/mm (ref 0.0–0.7)
Eosinophil %: 0.9 %
HCT: 43.1 % (ref 35.0–47.0)
HGB: 14.8 g/dL (ref 12.0–16.0)
LYMPHS PCT: 21.5 %
Lymphocyte #: 1.3 x10 3/mm (ref 1.0–3.6)
MCH: 34.2 pg — ABNORMAL HIGH (ref 26.0–34.0)
MCHC: 34.2 g/dL (ref 32.0–36.0)
MCV: 100 fL (ref 80–100)
MONO ABS: 0.7 x10 3/mm (ref 0.2–0.9)
Monocyte %: 11.9 %
NEUTROS PCT: 64.3 %
Neutrophil #: 4 x10 3/mm (ref 1.4–6.5)
PLATELETS: 204 x10 3/mm (ref 150–440)
RBC: 4.32 10*6/uL (ref 3.80–5.20)
RDW: 12.3 % (ref 11.5–14.5)
WBC: 6.2 x10 3/mm (ref 3.6–11.0)

## 2013-12-22 ENCOUNTER — Ambulatory Visit: Payer: Self-pay | Admitting: Radiation Oncology

## 2014-01-08 ENCOUNTER — Encounter: Payer: Self-pay | Admitting: General Surgery

## 2014-01-08 ENCOUNTER — Ambulatory Visit (INDEPENDENT_AMBULATORY_CARE_PROVIDER_SITE_OTHER): Payer: Self-pay | Admitting: General Surgery

## 2014-01-08 VITALS — BP 100/70 | HR 72 | Resp 16 | Ht 72.0 in | Wt 170.0 lb

## 2014-01-08 DIAGNOSIS — C50319 Malignant neoplasm of lower-inner quadrant of unspecified female breast: Secondary | ICD-10-CM

## 2014-01-08 DIAGNOSIS — C50312 Malignant neoplasm of lower-inner quadrant of left female breast: Secondary | ICD-10-CM

## 2014-01-08 NOTE — Patient Instructions (Addendum)
Continue self breast exams. Call office for any new breast issues or concerns. April 2016 bilateral diagnostic mammograms and office visit.

## 2014-01-08 NOTE — Progress Notes (Signed)
Patient ID: Susan Singleton, female   DOB: 11-30-1965, 48 y.o.   MRN: [id number]  Chief Complaint  Patient presents with  . Follow-up    left breast excision    HPI Susan Singleton is a 48 y.o. female here today for her two month follow up left breast wide excision done on 10/18/13. Patient state she is doing well. No new problems at this time.   She is using a right shoulder fracture using bone stimulator to see if that helps, followed by [employer]. Followed by Dr. Dossie Der [facility] regarding her RA.   Completed radiation 12-28-13. No new breast issues.  HPI  Past Medical History  Diagnosis Date  . Thyroid disease   . Arthritis   . H/O fibromyalgia   . Fall 09-24-13  . 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    Past Surgical History  Procedure Laterality Date  . Cyst removal leg Right 2000  . Ovarian cyst surgery  2004  . Breast surgery Left 09-17-13    INVASIVE MAMMARY CARCINOMA OF NO SPECIAL TYPE,/DCIS   . Breast surgery Left 10/18/13    Wide excision, mastoplasty. Sentinel node biopsy.    Family History  Problem Relation Age of Onset  . Cancer Mother 36    bladder and skin    Social History History  Substance Use Topics  . Smoking status: Former Smoker -- 1.00 packs/day for 20 years    Types: Cigarettes  . Smokeless tobacco: Never Used  . Alcohol Use: Yes    No Known Allergies  Current Outpatient Prescriptions  Medication Sig Dispense Refill  . folic acid (FOLVITE) 1 MG tablet Take 1 mg by mouth daily.      . meloxicam (MOBIC) 7.5 MG tablet Take 7.5 mg by mouth daily.      . methotrexate (RHEUMATREX) 2.5 MG tablet Take 2.5 mg by mouth once a week. Caution:Chemotherapy. Protect from light.      . predniSONE (STERAPRED UNI-PAK) 5 MG TABS tablet Take 1 mg by mouth daily.      . traMADol (ULTRAM) 50 MG tablet Take by mouth every 6 (six) hours as needed.      . vitamin E 100  UNIT capsule Take by mouth daily.      . Vitamins A & D (VITAMIN A & D) ointment Apply 1 application topically as needed for dry skin.       No current facility-administered medications for this visit.    Review of Systems Review of Systems  Constitutional: Negative.   Respiratory: Negative.   Cardiovascular: Negative.     Blood pressure 100/70, pulse 72, resp. rate 16, height 6' (1.829 m), weight 170 lb (77.111 kg), last menstrual period 07/01/2013.  Physical Exam Physical Exam  Constitutional: She is oriented to person, place, and time. She appears well-developed and well-nourished.  Neck: Neck supple.  Cardiovascular: Normal rate, regular rhythm and normal heart sounds.   Pulmonary/Chest: Effort normal and breath sounds normal.  Small amount erythremia and Superficial loss at 8 o'clock left breast  Lymphadenopathy:    She has no cervical adenopathy.  Neurological: She is alert and oriented to person, place, and time.  Skin: Skin is warm and dry.    Data Reviewed New new data.   Assessment    Minimal skin changes status post whole breast radiation with a boost to the left lower quadrant.  Increasing RA  symptoms off Enbrel.     Plan    The patient is scheduled to meet with her rheumatologist for the next month, and hopefully a suitable biologic without unacceptable immunosuppression can be identified. There will always be a trade off with increasing risk for new or recurrent cancers versus lifestyle possibilities by minimizing RA symptoms.  The patient was not thought to be a candidate4 adjuvant chemotherapy in spite of her HER-2 positive tumor because of the 2 mm size.  We'll plan for a follow up examination in spring 2016 with bilateral mammograms at that time.  April 2016 bilateral diagnostic mammograms and office visit.     Maryruth Eve, MD; [facility]; [facility]  Ref. MD: Dr. Vernie Ammons  Dr. Noreene Filbert   Robert Bellow 01/11/2014, 3:46 PM

## 2014-01-11 ENCOUNTER — Encounter: Payer: Self-pay | Admitting: General Surgery

## 2014-01-22 ENCOUNTER — Ambulatory Visit: Payer: Self-pay | Admitting: Radiation Oncology

## 2014-03-25 ENCOUNTER — Encounter: Payer: Self-pay | Admitting: General Surgery

## 2014-05-07 ENCOUNTER — Ambulatory Visit: Payer: Self-pay | Admitting: Oncology

## 2014-05-24 ENCOUNTER — Ambulatory Visit: Payer: Self-pay | Admitting: Oncology

## 2014-07-05 ENCOUNTER — Ambulatory Visit: Payer: Self-pay | Admitting: Radiation Oncology

## 2014-09-10 ENCOUNTER — Ambulatory Visit: Payer: TRICARE For Life (TFL) | Admitting: General Surgery

## 2014-09-10 ENCOUNTER — Ambulatory Visit
Admit: 2014-09-10 | Disposition: A | Discharge status: Home / Self Care | Payer: Self-pay | Attending: General Surgery | Admitting: General Surgery

## 2014-09-14 NOTE — Consult Note (Signed)
Reason for Visit: This 49 year old Female patient presents to the clinic for initial evaluation of  breast cancer .   Referred by Dr. Bary Castilla.  Diagnosis:  Chief Complaint/Diagnosis   49 year old female with stage I invasive mammary carcinoma ER/PR negative HER-2/neu overexpressed 2 mm focus of invasive disease sentinel node negative pathologic stage I (T1 A. N0 M0) for adjuvant whole breast radiation  Pathology Report pathology report reviewed   Imaging Report mammograms reviewed   Referral Report clinical notes reviewed   Planned Treatment Regimen whole breast radiation   HPI   patient is a 49 year old female who presents with an abnormal mammogram of the left breast showing a dense area of calcifications in the lower inner quadrant.this was confirmed on ultrasound. She underwent under stereotactic assisted biopsy which was positive for ductal carcinoma in situ with an 2 mm focus of invasive mammary carcinoma ER/PR negative HER-2/neu overexpressed. She will have a wide local excision. No residual invasive mammary carcinoma was seen. There was extensive DCIS component margins were clear at less than 1 mm for DCIS. 2 sentinel lymph nodes were negative. Case was discussed with medical oncology and not thought to be a candidate for systemic treatment. I discussed the case with surgical oncology personally. Patient is doing well at this time although she did fall and a fracture of her right humerus which is being addressed with an MRI scan next week. She specifically denies breast tenderness cough or bone pain.  Past Hx:    Right Bundle Branch Block:    Rheumatoid Arthritis:    Hyperthyroidism: no meds   Cancer, Breast: left   foot surgery:    miscarriage:    salpingoophorectomy:    right leg cyst removal:    Left breast bx: May 2015  Past, Family and Social History:  Past Medical History positive   Cardiovascular right bundle branch block   Endocrine hyperthyroidism    Past Surgical History salpingo-oophorectomy, miscarriage, foot surgery, right leg cyst removed   Past Medical History Comments rheumatoid arthritis, fibromyalgia   Family History positive   Social History positive   Social History Comments 20 pack year smoking history no EtOH abuse history   Additional Past Medical and Surgical History seen by herself tonight   Allergies:   Percocet 10/325: Do NOT Use  Home Meds:  Home Medications: Medication Instructions Status  acetaminophen-HYDROcodone 325 mg-5 mg oral tablet 1 tab(s) orally every 4 to 6 hours, As needed, pain Active  folic acid 1 mg oral tablet 1 tab(s) orally once a day Active  meloxicam 7.5 mg oral tablet 1 tab(s) orally once a day Active  methotrexate 2.5 mg oral tablet 4  orally 2 times a day Active  predniSONE 5 mg oral tablet 1 tab(s) orally once a day Active  traMADol 50 mg oral tablet 1 tab(s) orally 3 times a day  Active  vitamin E 100 intl units oral capsule 1 cap(s) orally once a day Active  Vitamin D3 1000 intl units oral capsule 1 cap(s) orally once a day Active  Vitamin C 500 mg oral tablet 1 tab(s) orally once a day Active  biotin 2.5 mg oral tablet 1 tab(s) orally once a day Active  B Complex 50 1 tab(s) orally once a day Active  minocycline 50 mg oral tablet 1 tab(s) orally 2 times a day Active   Review of Systems:  General negative   Performance Status (ECOG) 0   Skin negative   Breast negative   Ophthalmologic  negative   ENMT negative   Respiratory and Thorax negative   Cardiovascular negative   Gastrointestinal negative   Genitourinary negative   Musculoskeletal negative   Neurological negative   Hematology/Lymphatics negative   Endocrine negative   Allergic/Immunologic negative   Review of Systems   review of systems obtained from nurses notes  Nursing Notes:  Nursing Vital Signs and Chemo Nursing Nursing Notes: *CC Vital Signs Flowsheet:   05-Jun-15 11:17  Temp  Temperature 96.8  Pulse Pulse 89  Respirations Respirations 20  SBP SBP 171  DBP DBP 73  Pain Scale (0-10)  0  Current Weight (kg) (kg) 76.3   Physical Exam:  General/Skin/HEENT:  General normal   Skin normal   Eyes normal   ENMT normal   Head and Neck normal   Additional PE well-developed well-nourished female in NAD. She has significant tenting of the skin. Lungs are clear to A&P cardiac examination shows regular rate and rhythm. Left breast is wide local excision scar which is healing well. No dominant mass or nodularity is noted in either breast in 2 positions examined. Lungs are clear to A&P. No axillary or supraclavicular adenopathy is identified. She's quite sensitive to touch of the right upper extremity secondary to recent traumatic fracture.   Breasts/Resp/CV/GI/GU:  Respiratory and Thorax normal   Cardiovascular normal   Gastrointestinal normal   Genitourinary normal   MS/Neuro/Psych/Lymph:  Musculoskeletal normal   Neurological normal   Lymphatics normal   LAB Results:  Laboratory Results: Pathology:  28-May-15 00:12   Pathology Report ========== TEST NAME ==========  ========= RESULTS =========  = REFERENCE RANGE =  PATHOLOGY REPORT  Pathology Report .                               [   Final Report         ]                   Material submitted:         Marland Kitchen PART A: SENTINEL NODE #1 PART B: LEFT BREAST 8:00 WIDE EXCISION .                               [   Final Report         ]                   Pre-operative diagnosis:                                        . DCIS LEFT BREAST WIDE EXCISION LEFT BREAST 8:00 .                               [   Final Report         ]                   Frozen section diagnosis:                                       . FSA1/A2. SENTINEL LYMPH NODE #1, EXCISION:          -  2 SENTINEL LYMPH NODES NEGATIVE FOR            MACROMETASTASIS.          - CALLED TO DR. BYRNETT AT 1:34 PM ON 10/18/13. IOCB.    LEFT BREAST,  WIDE EXCISION:          - MARGINS GROSSLY CLEAR.          - CALLED TO DR. BYRNETT AT 1:39 PM ON 10/18/13. . DR. DANA BAKER /KCT .                              [   Final Report         ]                   ********************************************************************** Diagnosis: Part A: SENTINEL NODE #1, EXCISION: - NO TUMOR SEEN IN TWO SENTINEL LYMPH NODES (0/2). . Part B: LEFT BREAST 8:00 WIDE EXCISION: - INVASIVE MAMMARY CARCINOMA, NO SPECIAL TYPE IN A PREVIOUS SPECIMEN, SEE SUMMARY BELOW. - DUCTAL CARCINOMA IN SITU, NUCLEAR GRADE 3, COMEDO TYPE WITH MICROCALCIFICATIONS AND EXTENSIVE INTRADUCTAL COMPONENT. - BIOPSY SITE CHANGES, MARKER CLIP PRESENT. Marland Kitchen CANCER CASE SUMMARY: INVASIVE CARCINOMA OF THE BREAST (based on current and previous case: [id number]) PROCEDURE: Wide excision without wire-guided localization LYMPH NODE SAMPLING: Sentinel lymph nodes SPECIMEN LATERALITY: Left HISTOLOGIC TYPE OF INVASIVE CARCINOMA: Ductal, no special type TUMOR SIZE: 2 mm HISTOLOGIC GRADE: Nottingham Histologic Score Glandular / tubular differentiation: Score 3 Nuclear pleomorphism: Score 3 Mitotic rate: Score 1 Overal Grade: Grade 2 TUMOR FOCALITY: Single focus of invasive carcinoma DUCTAL CARCINOMA IN SITU (DCIS): DCIS is present and positive for extensive intraductal component (EIC). Size of DCIS: At least 32 mm and present in8 of 12 tissue blocks. MARGINS: Invasive carcinoma: Invasive carcinoma not present in current specimen         DCIS: Margins uninvolved by DCIS; Distance from closest margin: <1 mm (cranial) LYMPH NODES:         Number of sentinel lymph nodes examined: 2         Total number of lymph nodes examined: 2         Number of lymph nodes with macrometastases: 0         Number of lymph nodes with micrometastases: 0         Number of lymph nodes with isolated tumor cells: 0         Number of lymph nodes without tumor cells identified: 2 PATHOLOGIC  STAGING, AJCC 7TH EDITION: Primary tumor: pT1a Regional lymph nodes: pN0(sn) Distant metastasis: not applicable ANCILLARY STUDIES: Testing was performed by [facility] on a separate specimen. In summary:         Estrogen receptor IHC: Negative         Progesterone receptor: Negative         HER2-neu FISH: Amplified (Positive) . XDB/10/22/2013 ********************************************************************** .                               [   Final Report         ]                   Electronically signed:                                     .  Lum Babe, MD, Pathologist .                               [   Final Report         ]                  Gross description:                                         . A. Received fresh from the operating room in a container labeled Sabriyah Wilcher and sentinel node #1 is a piece of pale yellow adipose tissue measuring 2.5 x 2.2 x 0.5 cm in greatest dimensions. Upon dissection there are two pale tan lymph nodes noted ranging from 1 x 0.5 x 0.3 cm up to 1.5 x 0.8 x 0.5 cm in greatest dimensions. The lymph nodes are processed for frozen section, and entirely submitted and designated: A1 - frozen section smaller lymph node; A2 - frozen section larger lymph node; A3 - rest of adipose tissue. Total of 3 cassettes in blocks A1-A3. The specimen is collected at 1:20 pm on 10/18/13 and placed into formalin at 1:36 pm on 10/18/13. The approximate total length of neutral buffered formalin fixation time is 9.5 hours. . B. Received fresh from the operating room in a transpec container labeled Candi Profit and wide excision left breast 8:00 is an ellipse of pale tan skin with attached underlying pink yellow fibrofatty breast tissue. The skin ellipse measures 6.5 x 2 cm in greatest dimensions and is unremarkable. The underlying breast tissue measures 11 x 4.5 x 4.3 cm in greatest dimensions. Orienting metallic markers  are present and designated: cranial, caudal, and medial. There is accompanying specimen xray film which shows a metallic marker within the specimen. Serially sectioned through the breast tissue reveals a biopsy cavity containing blood clot that measures 2.9 x 1.9 x 1.3 cm in greatest dimensions. Adjacent to the biopsy cavity there appears to be a residual mass that measures up to 1.7 x 1.2 x 0.5 cm in approximate dimension. The biopsy cavity is located 0.7 cm away from the nearest overlying perpendicular caudal margin of resection and 1 cm or greater from all other margins of resection. The residual mass appears to be grossly located 0.8 cm away from the nearest overlying perpendicular cranial margin of resection and 1 cm or greater from all other margins of resection. The rest of the breast tissue is predominantly fatty with small amounts of interspersed white fibrous tissue and no other discrete lesions grossly noted. Representative sections are submitted and designated: B1-B2 - sections of possible residual mass with overlying perpendicular cranial margin of resection; B3-B5 - full face sections of possible residual mass; B6-B7 - representative perpendicular sections of caudal margin with underlying biopsy cavity; B8 - representative section of skin; B9 - representative perpendicular medial margin; B10 - representative perpendicular lateral margin; B11 - representative perpendicular superficial margin; B12 - representative perpendicular deep margin. Total of 12 cassettes in blocks B1-B12. . The specimen is collected at 1:25 pm on 10/18/13 and placed into formalin at 1:41 pm on 10/18/13. The approximate total length of neutral buffered formalin fixation time is 9.5 hours. [employer] Flavors code: Cranial - blue; Caudal - green; Medial - yellow; Lateral - orange; Superficial - red; Deep - black. BMD/KCT .                               [  Final Report         ]                    Pathologist provided ICD-9: 233.0, 174.9 .     [   Final Report         ]                   CPT                                                        . [id number], [id number], [id number], [phone], [place]            No: [id number]           [address] [place], [place] [address]           [place]on Romp, [place]                                         [phone] [id number]   Result(s) reported on 22 Oct 2013 at 04:54PM.   Other Results:  Radiology Results: LabUnknown:    22-Apr-15 11:26, Digital Additional Views Lt Breast [facility])  PACS Image   [facility]:  Digital Additional Views Lt Breast (SCR)   REASON FOR EXAM:    LT MICROCALS OUTSIDE  COMMENTS:       PROCEDURE: MAM - MAM DGTL ADD VW LT  SCR  - Sep 12 2013 11:26AM     CLINICAL DATA:  Suspicious calcifications identified in the left  breast on recent screening mammogram performed at [place]  dated 08/29/2013. Patient reports a history of rheumatoid arthritis  and is on immunosuppressive therapy.    EXAM:  DIGITAL DIAGNOSTIC  LEFT MAMMOGRAM WITH CAD    ULTRASOUND LEFT BREAST  COMPARISON:  08/29/2013 and 01/06/2011    ACR Breast Density Category c: The breast tissue is heterogeneously  dense, which may obscure small masses.    FINDINGS:  Magnification views of the lower inner quadrant of the left breast  demonstrate pleomorphic calcifications that span 2.3 x 1.7 x 1.0 cm.  Thereis an ill-defined mass associated with the calcifications.  Please note that there are few scattered calcifications in this  region of the left breast center separate from the tight group of  calcifications.    Mammographic images were processed withCAD.  On physical exam, I do not palpate a discrete mass in the lower  inner quadrant of the left breast.    Ultrasound is performed, showing an irregular hypoechoic mass with  multiple small hyperechoic foci within it consistent  with the  calcifications seen on mammography. The mass is a 7 o'clock position  3 cm from the nipple and measures approximately 1.5 x 1.7 x 1.6 cm.  The margins of the mass are indistinct. There is some vascular flow  along the periphery of the mass focally.    Ultrasound of the left axilla demonstrates normal left axillary  lymph node. No lymphadenopathy is detected.     IMPRESSION:  Suspicious mass with associated pleomorphic calcifications in  the  lower inner quadrant of the left breast.    RECOMMENDATION:  Tissuesampling is recommended. This mass with calcifications is  visible sonographically. Ultrasound-guided biopsy is suggested.  Findings and recommendations were discussed with the patient today.  Angela Nevin from our office will contact Dr. Lucianne Lei Dalen's office  with this report.    I have discussed the findings and recommendations with the patient.  Results were also provided in writing at the conclusion of the  visit. If applicable, a reminder letter will be sent to the patient  regarding the nextappointment.    BI-RADS CATEGORY  5: Highly suggestive of malignancy.  Electronically Signed    By: Curlene Dolphin M.D.    On: 09/12/2013 13:55         Verified By: Sheppard Evens, M.D.,   Relevent Results:   Relevant Scans and Labs mammograms and ultrasound are reviewed   Assessment and Plan: Impression:   49 year old female with stage I left breast cancer ER/PR negative HER-2/neu overexpressed 2 mm focus of disease status post wide local excision and sentinel node biopsy for adjuvant whole breast radiation Plan:   patient is currently being worked up with an MRI next week of her right upper extremity for traumatic fracture. Will await orthopedic recommendations on that. I have recommended going ahead with whole breast radiation therapy to 5000 cGy over 5 weeks. Would also boost her scar another 1600 cGy based on the close DCIS margin. I discussed the case with surgical  oncology. Case was also discussed with medical oncology and patient is not thought to be a candidate for systemic chemotherapy despite her HER-2/neu amplification. I am sending the patient to medical oncology for complete evaluation. I have set her up for CT simulation the middle of next week. Risks and benefits of treatment including skin reaction, fatigue, inclusion of superficial long, alteration of blood counts, all were explained in detail to the patient. She seems to comprehend my treatment plan well.  I would like to take this opportunity for allowing me to participate in the care of your patient..  CC Referral:  cc: Dr. Bary Castilla   Electronic Signatures: Baruch Gouty, Roda Shutters (MD)  (Signed 05-Jun-15 13:00)  Authored: HPI, Diagnosis, Past Hx, PFSH, Allergies, Home Meds, ROS, Nursing Notes, Physical Exam, LAB Results, Other Results, Relevent Results, Encounter Assessment and Plan, CC Referring Physician   Last Updated: 05-Jun-15 13:00 by Armstead Peaks (MD)

## 2014-09-14 NOTE — Op Note (Signed)
PATIENT NAME:  Susan Singleton, Susan Singleton MR#:  [id number] DATE OF BIRTH:  February 26, 1966  DATE OF PROCEDURE:  10/18/2013  PREOPERATIVE DIAGNOSIS: Invasive mammary carcinoma of the left breast.   POSTOPERATIVE DIAGNOSIS: Invasive mammary carcinoma of the left breast.    OPERATIVE PROCEDURE: Wide local excision, mastoplasty and sentinel node biopsy.   SURGEON: Hervey Ard, MD.   ANESTHESIA: General by LMA, Marcaine 0.5% with 1:200,000 units of epinephrine, 30 mL local infiltration.   ESTIMATED BLOOD LOSS: Minimal.   CLINICAL NOTE: This 49 year old woman had an abnormal mammogram with a dense area of calcifications in the lower inner quadrant of the left breast. Vacuum-assisted biopsy showed DCIS as well as a 2 mm foci ER/PR negative invasive mammary carcinoma. She desired breast conservation. She underwent injection with technetium sulfur colloid prior to the procedure.   OPERATIVE NOTE: With the patient under adequate general anesthesia, 4 mL of methylene blue diluted 1:2 with normal saline was injected in the subareolar plexus. The breast and chest and axilla was prepped with ChloraPrep and draped. The area of the lower inner quadrant of the left breast was scanned with ultrasound to outline the previously identified biopsy cavity. It was fairly close to the skin and it was elected to take an ellipse of skin with the primary site. The ellipse was approximately 3 cm in diameter and extended from the base of the nipple to the periphery of the breast at the inframammary fold. The skin was incised sharply and the remaining dissection completed with electrocautery. The dissection was carried down to and, in part, included the pectoralis fascia. Specimen was orientated and specimen radiograph obtained showing the presence of the previously placed biopsy clip. Gross examination by Delorse Lek, M.D. from pathology showed no evidence of margin involvement. The breast was mobilized off the underlying pectoralis muscle in all  directions for approximately a 6 to 7 cm distance. This allowed approximation of the fascia with interrupted 2-0 Vicryl figure-of-eight sutures. The adipose tissue was then freed with a 5 mm flap elevated again for a 5 cm distance. This was again approximated interrupted 2-0 Vicryl figure-of-eight sutures. The deep dermal layer was approximated with interrupted 2-0 Vicryl sutures and then interrupted 4-0 Vicryl subcuticular sutures were used to reapproximate the areolar skin and a running 4-0 Vicryl subcuticular suture was used to close the remaining breast tissue.   The axilla was scanned and an area of increased uptake at the base of axillary envelope was identified. Marcaine was infiltrated here as well as previously at the breast for postoperative analgesia. A transverse incision was made and carried down through the skin and subcutaneous tissue. Hemostasis was with electrocautery. A slightly large plump blue node was identified. Frozen section report was negative for macro-metastatic disease. The wound was closed in layers with 2-0 Vicryl deep and a running 4-0 Vicryl subcuticular suture for the skin. Dermabond was applied to both lesions. Fluff gauze, Kerlix and an Ace wrap was then applied. The patient tolerated the procedure well and was taken to the recovery room in stable condition.   ____________________________ Robert Bellow, MD jwb:aw D: 10/19/2013 09:58:04 ET T: 10/19/2013 10:06:14 ET JOB#: [id number]  cc: Robert Bellow, MD, <Dictator> Eusebio Me, MD Krissa Utke Amedeo Kinsman MD ELECTRONICALLY SIGNED 10/22/2013 15:40

## 2014-09-18 ENCOUNTER — Ambulatory Visit: Payer: Self-pay | Admitting: General Surgery

## 2014-09-26 ENCOUNTER — Other Ambulatory Visit: Payer: Self-pay | Admitting: *Deleted

## 2014-09-26 ENCOUNTER — Encounter: Payer: Self-pay | Admitting: General Surgery

## 2014-09-26 ENCOUNTER — Ambulatory Visit (INDEPENDENT_AMBULATORY_CARE_PROVIDER_SITE_OTHER): Payer: TRICARE For Life (TFL) | Admitting: General Surgery

## 2014-09-26 VITALS — BP 122/86 | HR 60 | Resp 12 | Ht 71.0 in | Wt 180.0 lb

## 2014-09-26 DIAGNOSIS — H8110 Benign paroxysmal vertigo, unspecified ear: Secondary | ICD-10-CM | POA: Diagnosis not present

## 2014-09-26 DIAGNOSIS — C50312 Malignant neoplasm of lower-inner quadrant of left female breast: Secondary | ICD-10-CM

## 2014-09-26 NOTE — Patient Instructions (Signed)
Follow up in one year. Referral to [facility].

## 2014-09-26 NOTE — Progress Notes (Signed)
Patient ID: Susan Singleton, female   DOB: Dec 26, 1965, 49 y.o.   MRN: [id number]  Chief Complaint  Patient presents with  . Follow-up    mammogram    HPI Susan Singleton is a 49 y.o. female who presents for her follow up mammogram and breast evaluation. The most recent mammogram was done on 09-10-14. Patient does perform regular self breast checks and gets regular mammograms done.  No new breast issues. She states the left breast was tender with the mammogram. She reports problems with vertigo for the past year that has worsened in the past few months.       HPI  Past Medical History  Diagnosis Date  . Thyroid disease   . Arthritis   . H/O fibromyalgia   . Fall 09-24-13  . 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    Past Surgical History  Procedure Laterality Date  . Cyst removal leg Right 2000  . Ovarian cyst surgery  2004  . Breast surgery Left 09-17-13    INVASIVE MAMMARY CARCINOMA OF NO SPECIAL TYPE,/DCIS   . Breast surgery Left 10/18/13    Wide excision, mastoplasty. Sentinel node biopsy.    Family History  Problem Relation Age of Onset  . Cancer Mother 92    bladder and skin    Social History History  Substance Use Topics  . Smoking status: Former Smoker -- 1.00 packs/day for 20 years    Types: Cigarettes  . Smokeless tobacco: Never Used  . Alcohol Use: Yes    No Known Allergies  Current Outpatient Prescriptions  Medication Sig Dispense Refill  . folic acid (FOLVITE) 1 MG tablet Take 1 mg by mouth daily.    . magnesium 30 MG tablet Take 30 mg by mouth daily.    . meloxicam (MOBIC) 7.5 MG tablet Take 7.5 mg by mouth daily.    . methotrexate (RHEUMATREX) 2.5 MG tablet Take 2.5 mg by mouth once a week. Caution:Chemotherapy. Protect from light.    . minocycline (MINOCIN,DYNACIN) 50 MG capsule Take 50 mg by mouth 2 (two) times daily.     . Omega-3 Fatty Acids (FISH OIL PO) Take by mouth  daily.    . predniSONE (STERAPRED UNI-PAK) 5 MG TABS tablet Take 1 mg by mouth daily.    . traMADol (ULTRAM) 50 MG tablet Take by mouth every 6 (six) hours as needed.    . TURMERIC PO Take by mouth daily.    . vitamin E 100 UNIT capsule Take by mouth daily.    . Vitamins A & D (VITAMIN A & D) ointment Apply 1 application topically as needed for dry skin.    . Tofacitinib Citrate 5 MG TABS Take 5 mg by mouth 2 (two) times daily.      No current facility-administered medications for this visit.    Review of Systems Review of Systems  Constitutional: Negative.   Respiratory: Negative.   Cardiovascular: Negative.     Blood pressure 122/86, pulse 60, resp. rate 12, height 5\' 11"  (1.803 m), weight 180 lb (81.647 kg), last menstrual period 06/28/2013.  Physical Exam Physical Exam  Constitutional: She is oriented to person, place, and time. She appears well-developed and well-nourished.  Eyes: Conjunctivae are normal. No scleral icterus.  Neck: Neck supple.  Cardiovascular: Normal rate, regular rhythm and normal heart sounds.   Pulmonary/Chest: Effort normal and breath sounds normal. Right breast exhibits  no inverted nipple, no mass, no nipple discharge, no skin change and no tenderness. Left breast exhibits no inverted nipple, no mass, no nipple discharge, no skin change and no tenderness.    Left breast good volume preservation, well healed incision at 7 o'clock.   Lymphadenopathy:    She has no cervical adenopathy.    She has no axillary adenopathy.  Neurological: She is alert and oriented to person, place, and time.    Data Reviewed Bilateral mammograms dated 09/10/2014 were reviewed. Postsurgical changes. BI-RADS-2.  Assessment    Left breast cancer without evidence of local recurrence.  Vertigo    Plan    The patient has been asked to return to the office in one year with a bilateral diagnostic mammogram.   Colonoscopy will be discussed next year.  The patient will  be referred to [facility] regarding vertigo. Patient wishes to call and make her own appointment due to her work schedule.    PCP:  Joellyn Quails 09/27/2014, 11:18 AM

## 2014-09-27 DIAGNOSIS — H8110 Benign paroxysmal vertigo, unspecified ear: Secondary | ICD-10-CM | POA: Insufficient documentation

## 2015-06-24 ENCOUNTER — Other Ambulatory Visit: Payer: Self-pay | Admitting: *Deleted

## 2015-06-24 DIAGNOSIS — C50312 Malignant neoplasm of lower-inner quadrant of left female breast: Secondary | ICD-10-CM

## 2015-07-01 ENCOUNTER — Encounter: Payer: Self-pay | Admitting: *Deleted

## 2015-09-16 ENCOUNTER — Ambulatory Visit
Admission: RE | Admit: 2015-09-16 | Discharge: 2015-09-16 | Disposition: A | Discharge status: Home / Self Care | Payer: TRICARE For Life (TFL) | Source: Ambulatory Visit | Attending: General Surgery | Admitting: General Surgery

## 2015-09-16 ENCOUNTER — Other Ambulatory Visit: Payer: Self-pay | Admitting: General Surgery

## 2015-09-16 DIAGNOSIS — Z853 Personal history of malignant neoplasm of breast: Secondary | ICD-10-CM | POA: Diagnosis not present

## 2015-09-16 DIAGNOSIS — C50312 Malignant neoplasm of lower-inner quadrant of left female breast: Secondary | ICD-10-CM

## 2015-09-16 DIAGNOSIS — Z9889 Other specified postprocedural states: Secondary | ICD-10-CM | POA: Diagnosis not present

## 2015-09-16 HISTORY — DX: Malignant neoplasm of unspecified site of unspecified female breast: C50.919

## 2015-09-23 ENCOUNTER — Ambulatory Visit: Payer: TRICARE For Life (TFL) | Admitting: General Surgery

## 2015-10-29 ENCOUNTER — Encounter: Payer: Self-pay | Admitting: *Deleted

## 2017-01-10 ENCOUNTER — Telehealth: Payer: Self-pay | Admitting: *Deleted

## 2017-01-10 NOTE — Telephone Encounter (Signed)
Patient called the office wanting [place] to order a mammogram for her. She tried to schedule on her own but was told she had to call here.   Patient was last seen in 2016 and we placed patient in the recalls for 09-2015 which patient was a no show for.   This patient states she feels she has scar tissue around the incision where her left lumpectomy was done and the area is sore.  She is requesting we arrange her mammogram. Monday, Tuesday, and Wednesday mid morning appointments are best for her since she travels from [place].

## 2017-01-19 NOTE — Telephone Encounter (Signed)
Patient called previously on 01/10/17 and has not heard anything as of yet. Would like to get her mammogram scheduled at [facility]. Please advise. Thank you.

## 2017-01-20 NOTE — Telephone Encounter (Signed)
No record of any physician evaluation of her breast since May 2016. 2017 mammograms ordered and the patient failed to come for her scheduled appointment.  Should the patient desire to have our office responsible for her mammogram she will need to present for an evaluation first.

## 2017-02-01 ENCOUNTER — Encounter: Payer: Self-pay | Admitting: *Deleted

## 2017-02-07 ENCOUNTER — Ambulatory Visit (INDEPENDENT_AMBULATORY_CARE_PROVIDER_SITE_OTHER): Payer: TRICARE For Life (TFL) | Admitting: General Surgery

## 2017-02-07 ENCOUNTER — Encounter: Payer: Self-pay | Admitting: General Surgery

## 2017-02-07 VITALS — BP 122/68 | HR 76 | Resp 12 | Ht 72.0 in | Wt 193.0 lb

## 2017-02-07 DIAGNOSIS — Z171 Estrogen receptor negative status [ER-]: Secondary | ICD-10-CM

## 2017-02-07 DIAGNOSIS — Z1211 Encounter for screening for malignant neoplasm of colon: Secondary | ICD-10-CM | POA: Insufficient documentation

## 2017-02-07 DIAGNOSIS — C50312 Malignant neoplasm of lower-inner quadrant of left female breast: Secondary | ICD-10-CM | POA: Diagnosis not present

## 2017-02-07 NOTE — Progress Notes (Signed)
Patient ID: Susan Singleton, female   DOB: 27-Aug-1965, 51 y.o.   MRN: [id number]  Chief Complaint  Patient presents with  . Breast Cancer    HPI Susan Singleton is a 51 y.o. female here today for her follow up left breast cancer. Patient states she is doing well. Missed her appt in 2017. Last mammogram was on 09/08/2015.  Long history of rheumatoid arthritis previously managed with Enbrel. This was discontinued after her cancer diagnosis and a second medication substituted. Now making use of methotrexate. She'll be changing rheumatologists in the near future due to insurance issues. HPI  Past Medical History:  Diagnosis Date  . Arthritis   . Breast cancer ([place]) 2015   Left breast - radiation  . 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  . Fall 09-24-13  . H/O fibromyalgia   . Thyroid disease     Past Surgical History:  Procedure Laterality Date  . BREAST SURGERY Left 09-17-13   INVASIVE MAMMARY CARCINOMA OF NO SPECIAL TYPE,/DCIS   . BREAST SURGERY Left 10/18/13   Wide excision, mastoplasty. Sentinel node biopsy.  [facility] CYST REMOVAL LEG Right 2000  . OVARIAN CYST SURGERY  2004    Family History  Problem Relation Age of Onset  . Cancer Mother 73       bladder and skin    Social History Social History  Substance Use Topics  . Smoking status: Former Smoker    Packs/day: 1.00    Years: 20.00    Types: Cigarettes  . Smokeless tobacco: Never Used  . Alcohol use Yes    No Known Allergies  Current Outpatient Prescriptions  Medication Sig Dispense Refill  . folic acid (FOLVITE) 1 MG tablet Take 1 mg by mouth daily.    . meloxicam (MOBIC) 7.5 MG tablet Take 7.5 mg by mouth daily.    . methotrexate (RHEUMATREX) 2.5 MG tablet Take 2.5 mg by mouth once a week. Caution:Chemotherapy. Protect from light.    . minocycline (MINOCIN,DYNACIN) 50 MG capsule Take 50 mg by mouth 2 (two) times daily.     . predniSONE  (STERAPRED UNI-PAK) 5 MG TABS tablet Take 2.5 mg by mouth daily.     . traMADol (ULTRAM) 50 MG tablet Take 50 mg by mouth every 12 (twelve) hours as needed.     . Vitamin D, Ergocalciferol, (DRISDOL) 50000 units CAPS capsule Take 50,000 Units by mouth every 7 (seven) days.     No current facility-administered medications for this visit.     Review of Systems Review of Systems  Constitutional: Negative.   Respiratory: Negative.   Cardiovascular: Negative.     Blood pressure 122/68, pulse 76, resp. rate 12, height 6' (1.829 m), weight 193 lb (87.5 kg), last menstrual period 07/01/2013.  Physical Exam Physical Exam  Constitutional: She is oriented to person, place, and time. She appears well-developed and well-nourished.  Eyes: Conjunctivae are normal. No scleral icterus.  Neck: Neck supple.  Cardiovascular: Normal rate, regular rhythm and normal heart sounds.   Pulmonary/Chest: Effort normal and breath sounds normal. Right breast exhibits no inverted nipple, no mass, no nipple discharge, no skin change and no tenderness. Left breast exhibits no inverted nipple, no mass, no nipple discharge, no skin change and no tenderness.    Lymphadenopathy:    She has no cervical adenopathy.    She has no axillary adenopathy.  Neurological: She is alert  and oriented to person, place, and time.  Skin: Skin is warm and dry.    Data Reviewed Patient was found to have an area of high-grade DCIS and a 2 mm foci of invasive cancer.. Sentinel nodes negative. Closest margin was cranial and was less than 1 mm. Acceptable in 2015.    Assessment    No evidence of recurrent cancer.  Candidate for screening colonoscopy.  Rheumatoid arthritis presently requiring methotrexate and steroids.    Plan    The patient will meet with her new rheumatologist soon, and if there is going to be a transition off methotrexate Bactroban logic would likely try to do her colonoscopy while she is off  immunosuppression. If she can continue on her present medication would proceed without other caveats.    Patient to have a bilateral diagnotic mammogram now than follow up in one  one year after diagnotic mammogram.  The patient is aware to call back for any questions or concerns.   Colonoscopy with possible biopsy/polypectomy prn: Information regarding the procedure, including its potential risks and complications (including but not limited to perforation of the bowel, which may require emergency surgery to repair, and bleeding) was verbally given to the patient. Educational information regarding lower intestinal endoscopy was given to the patient. Written instructions for how to complete the bowel prep using Miralax were provided. The importance of drinking ample fluids to avoid dehydration as a result of the prep emphasized.   HPI, Physical Exam, Assessment and Plan have been scribed under the direction and in the presence of Susan Ard, MD.  Susan Singleton, CMA  I have completed the exam and reviewed the above documentation for accuracy and completeness.  I agree with the above.  Haematologist has been used and any errors in dictation or transcription are unintentional.  Susan Singleton, M.D., F.A.C.S.  Susan Singleton 02/07/2017, 3:54 PM   Patient has been scheduled for a bilateral diagnostic mammogram at [facility] for 02-21-17 at 1:40 pm. She is aware of date, time, and instructions.   The patient has been asked to return to the office in one year with a bilateral diagnostic mammogram. She will be placed in the recalls.   Patient will call to arrange colonoscopy at her convenience. Miralax prescription will be sent once date arranged. Colonoscopy instructions have been provided to the patient today.   Dominga Ferry, CMA

## 2017-02-07 NOTE — Patient Instructions (Addendum)
Patient to have a bilateral diagnotic mammogram now than follow up in one  one year after diagnotic mammogram.  The patient is aware to call back for any questions or concerns. Colonoscopy, Adult A colonoscopy is an exam to look at the entire large intestine. During the exam, a lubricated, bendable tube is inserted into the anus and then passed into the rectum, colon, and other parts of the large intestine. A colonoscopy is often done as a part of normal colorectal screening or in response to certain symptoms, such as anemia, persistent diarrhea, abdominal pain, and blood in the stool. The exam can help screen for and diagnose medical problems, including:  Tumors.  Polyps.  Inflammation.  Areas of bleeding.  Tell a health care provider about:  Any allergies you have.  All medicines you are taking, including vitamins, herbs, eye drops, creams, and over-the-counter medicines.  Any problems you or family members have had with anesthetic medicines.  Any blood disorders you have.  Any surgeries you have had.  Any medical conditions you have.  Any problems you have had passing stool. What are the risks? Generally, this is a safe procedure. However, problems may occur, including:  Bleeding.  A tear in the intestine.  A reaction to medicines given during the exam.  Infection (rare).  What happens before the procedure? Eating and drinking restrictions Follow instructions from your health care provider about eating and drinking, which may include:  A few days before the procedure - follow a low-fiber diet. Avoid nuts, seeds, dried fruit, raw fruits, and vegetables.  1-3 days before the procedure - follow a clear liquid diet. Drink only clear liquids, such as clear broth or bouillon, black coffee or tea, clear juice, clear soft drinks or sports drinks, gelatin dessert, and popsicles. Avoid any liquids that contain red or purple dye.  On the day of the procedure - do not eat  or drink anything during the 2 hours before the procedure, or within the time period that your health care provider recommends.  Bowel prep If you were prescribed an oral bowel prep to clean out your colon:  Take it as told by your health care provider. Starting the day before your procedure, you will need to drink a large amount of medicated liquid. The liquid will cause you to have multiple loose stools until your stool is almost clear or light green.  If your skin or anus gets irritated from diarrhea, you may use these to relieve the irritation: ? Medicated wipes, such as adult wet wipes with aloe and vitamin E. ? A skin soothing-product like petroleum jelly.  If you vomit while drinking the bowel prep, take a break for up to 60 minutes and then begin the bowel prep again. If vomiting continues and you cannot take the bowel prep without vomiting, call your health care provider.  General instructions  Ask your health care provider about changing or stopping your regular medicines. This is especially important if you are taking diabetes medicines or blood thinners.  Plan to have someone take you home from the hospital or clinic. What happens during the procedure?  An IV tube may be inserted into one of your veins.  You will be given medicine to help you relax (sedative).  To reduce your risk of infection: ? Your health care team will wash or sanitize their hands. ? Your anal area will be washed with soap.  You will be asked to lie on your side with your knees bent.  Your health care provider will lubricate a long, thin, flexible tube. The tube will have a camera and a light on the end.  The tube will be inserted into your anus.  The tube will be gently eased through your rectum and colon.  Air will be delivered into your colon to keep it open. You may feel some pressure or cramping.  The camera will be used to take images during the procedure.  A small tissue sample may be  removed from your body to be examined under a microscope (biopsy). If any potential problems are found, the tissue will be sent to a lab for testing.  If small polyps are found, your health care provider may remove them and have them checked for cancer cells.  The tube that was inserted into your anus will be slowly removed. The procedure may vary among health care providers and hospitals. What happens after the procedure?  Your blood pressure, heart rate, breathing rate, and blood oxygen level will be monitored until the medicines you were given have worn off.  Do not drive for 24 hours after the exam.  You may have a small amount of blood in your stool.  You may pass gas and have mild abdominal cramping or bloating due to the air that was used to inflate your colon during the exam.  It is up to you to get the results of your procedure. Ask your health care provider, or the department performing the procedure, when your results will be ready. This information is not intended to replace advice given to you by your health care provider. Make sure you discuss any questions you have with your health care provider. Document Released: 05/07/2000 Document Revised: 03/10/2016 Document Reviewed: 07/22/2015 Elsevier Interactive Patient Education  2018 [employer].

## 2017-02-13 ENCOUNTER — Encounter: Payer: Self-pay | Admitting: General Surgery

## 2017-02-13 NOTE — Progress Notes (Signed)
Reviewed rheumatology notes from Samule Ohm, MD from [facility]. Plans to maintain patient on methotrexate and QOD steroid for her RA.   February 10, 2017 labs: CRP: Normal at 1.4; ESR: 16.  Patient may proceed to colonoscopy on her present medications when convenient.

## 2017-02-14 NOTE — Progress Notes (Signed)
Message left for the patient to call back. She is clear to go ahead and schedule her Colonoscopy with [place].

## 2017-02-21 ENCOUNTER — Ambulatory Visit
Admission: RE | Admit: 2017-02-21 | Discharge: 2017-02-21 | Disposition: A | Discharge status: Home / Self Care | Payer: TRICARE For Life (TFL) | Source: Ambulatory Visit | Attending: General Surgery | Admitting: General Surgery

## 2017-02-21 DIAGNOSIS — Z171 Estrogen receptor negative status [ER-]: Secondary | ICD-10-CM | POA: Diagnosis not present

## 2017-02-21 DIAGNOSIS — C50312 Malignant neoplasm of lower-inner quadrant of left female breast: Secondary | ICD-10-CM

## 2017-02-21 DIAGNOSIS — Z9889 Other specified postprocedural states: Secondary | ICD-10-CM | POA: Diagnosis not present

## 2017-02-22 ENCOUNTER — Telehealth: Payer: Self-pay

## 2017-02-22 NOTE — Telephone Encounter (Signed)
-----   Message from Robert Bellow, MD sent at 02/22/2017  3:44 PM EDT -----   Please notify the patient that I reviewed her recent mammograms and no abnormalities are noted. Repeat in one year with office visit. ----- Message ----- From: Interface, Rad Results In Sent: 02/21/2017   2:21 PM To: Robert Bellow, MD

## 2017-07-07 ENCOUNTER — Encounter: Payer: Self-pay | Admitting: *Deleted

## 2017-10-25 ENCOUNTER — Ambulatory Visit (INDEPENDENT_AMBULATORY_CARE_PROVIDER_SITE_OTHER): Admitting: Obstetrics and Gynecology

## 2017-10-25 ENCOUNTER — Encounter: Payer: Self-pay | Admitting: Obstetrics and Gynecology

## 2017-10-25 VITALS — BP 122/78 | HR 75 | Ht 72.0 in | Wt 200.0 lb

## 2017-10-25 DIAGNOSIS — Z01419 Encounter for gynecological examination (general) (routine) without abnormal findings: Secondary | ICD-10-CM

## 2017-10-25 DIAGNOSIS — Z1231 Encounter for screening mammogram for malignant neoplasm of breast: Secondary | ICD-10-CM

## 2017-10-25 DIAGNOSIS — Z1211 Encounter for screening for malignant neoplasm of colon: Secondary | ICD-10-CM | POA: Diagnosis not present

## 2017-10-25 DIAGNOSIS — Z124 Encounter for screening for malignant neoplasm of cervix: Secondary | ICD-10-CM | POA: Diagnosis not present

## 2017-10-25 DIAGNOSIS — Z1151 Encounter for screening for human papillomavirus (HPV): Secondary | ICD-10-CM

## 2017-10-25 DIAGNOSIS — Z1239 Encounter for other screening for malignant neoplasm of breast: Secondary | ICD-10-CM

## 2017-10-25 LAB — HEMOCCULT GUIAC POC 1CARD (OFFICE): FECAL OCCULT BLD: NEGATIVE

## 2017-10-25 NOTE — Progress Notes (Signed)
PCP: Merrilee Seashore, MD   Chief Complaint  Patient presents with  . Gynecologic Exam    HPI:      [name] is a 52 y.o. G1P1 who LMP was Patient's last menstrual period was 07/01/2013., presents today for her annual examination.  Her menses are absent due to menopause. She does not have intermenstrual bleeding. She does have vasomotor sx that are tolerable.  Sex activity: single partner, contraception - post menopausal status. She does not have vaginal dryness.  Last Pap: March 08, 2016  Results were: no abnormalities /neg HPV DNA. On methotrexate so needs yearly paps.  Hx of STDs: none  Last mammogram: February 21, 2017  Results were: normal--routine follow-up in 12 months. Followed by DR. Byrnett There is no FH of breast cancer. There is no FH of ovarian cancer. Pt is s/p LT breast cancer 2015 with lumpectomy and radiation. Had neg genetic testing with Dr. Bary Castilla. The patient does do self-breast exams.  Colonoscopy: never; discussing with DR. Byrnett  Tobacco use: The patient denies current or previous tobacco use. Alcohol use: social drinker Exercise: not active  She does get adequate calcium and Vitamin D in her diet.  Labs with PCP.   Past Medical History:  Diagnosis Date  . Arthritis   . Breast cancer ([place]) 2015   Left breast - radiation  . 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  . Fall 09-24-13  . Genetic screening 10/04/2013   negative/Myriad  . H/O fibromyalgia   . Thyroid disease     Past Surgical History:  Procedure Laterality Date  . BREAST LUMPECTOMY Left 2015  . BREAST SURGERY Left 09-17-13   INVASIVE MAMMARY CARCINOMA OF NO SPECIAL TYPE,/DCIS   . BREAST SURGERY Left 10/18/13   Wide excision, mastoplasty. Sentinel node biopsy.  [facility] CYST REMOVAL LEG Right 2000  . SALPINGOOPHORECTOMY     dermoid cyst RT    Family History  Problem Relation Age of Onset    . Cancer Mother 68       bladder and skin  . Breast cancer Neg Hx     Social History   Socioeconomic History  . Marital status: Single    Spouse name: Not on file  . Number of children: Not on file  . Years of education: Not on file  . Highest education level: Not on file  Occupational History  . Not on file  Social Needs  . Financial resource strain: Not on file  . Food insecurity:    Worry: Not on file    Inability: Not on file  . Transportation needs:    Medical: Not on file    Non-medical: Not on file  Tobacco Use  . Smoking status: Former Smoker    Packs/day: 1.00    Years: 20.00    Pack years: 20.00    Types: Cigarettes  . Smokeless tobacco: Never Used  Substance and Sexual Activity  . Alcohol use: Yes  . Drug use: No  . Sexual activity: Yes    Birth control/protection: Post-menopausal  Lifestyle  . Physical activity:    Days per week: Not on file    Minutes per session: Not on file  . Stress: Not on file  Relationships  . Social connections:    Talks on phone: Not on file    Gets together: Not on file    Attends religious service:  Not on file    Active member of club or organization: Not on file    Attends meetings of clubs or organizations: Not on file    Relationship status: Not on file  . Intimate partner violence:    Fear of current or ex partner: Not on file    Emotionally abused: Not on file    Physically abused: Not on file    Forced sexual activity: Not on file  Other Topics Concern  . Not on file  Social History Narrative  . Not on file    Outpatient Medications Prior to Visit  Medication Sig Dispense Refill  . folic acid (FOLVITE) 1 MG tablet Take 1 mg by mouth daily.    . meloxicam (MOBIC) 7.5 MG tablet Take 7.5 mg by mouth daily.    . methotrexate (RHEUMATREX) 2.5 MG tablet Take 2.5 mg by mouth once a week. Caution:Chemotherapy. Protect from light.    . minocycline (MINOCIN,DYNACIN) 50 MG capsule Take 50 mg by mouth 2 (two) times  daily.     . predniSONE (DELTASONE) 2.5 MG tablet prednisone 2.5 mg tablet  Take 1 tablet every other day by oral route as directed.    . traMADol (ULTRAM) 50 MG tablet Take 50 mg by mouth every 12 (twelve) hours as needed.     . vitamin B-12 (CYANOCOBALAMIN) 1000 MCG tablet Take 1,000 mcg by mouth daily.    . Vitamin D, Ergocalciferol, (DRISDOL) 50000 units CAPS capsule Take 50,000 Units by mouth every 7 (seven) days.    . predniSONE (STERAPRED UNI-PAK) 5 MG TABS tablet Take 2.5 mg by mouth daily.      No facility-administered medications prior to visit.        ROS:  Review of Systems  Constitutional: Positive for fatigue. Negative for fever and unexpected weight change.  Respiratory: Negative for cough, shortness of breath and wheezing.   Cardiovascular: Negative for chest pain, palpitations and leg swelling.  Gastrointestinal: Negative for blood in stool, constipation, diarrhea, nausea and vomiting.  Endocrine: Negative for cold intolerance, heat intolerance and polyuria.  Genitourinary: Negative for dyspareunia, dysuria, flank pain, frequency, genital sores, hematuria, menstrual problem, pelvic pain, urgency, vaginal bleeding, vaginal discharge and vaginal pain.  Musculoskeletal: Negative for back pain, joint swelling and myalgias.  Skin: Negative for rash.  Neurological: Negative for dizziness, syncope, light-headedness, numbness and headaches.  Hematological: Negative for adenopathy.  Psychiatric/Behavioral: Negative for agitation, confusion, sleep disturbance and suicidal ideas. The patient is not nervous/anxious.    BREAST: No symptoms    Objective: BP 122/78   Pulse 75   Ht 6' (1.829 m)   Wt 200 lb (90.7 kg)   LMP 07/01/2013   BMI 27.12 kg/m    Physical Exam  Constitutional: She is oriented to person, place, and time. She appears well-developed and well-nourished.  Genitourinary: Rectum normal, vagina normal and uterus normal. There is no rash or tenderness on  the right labia. There is no rash or tenderness on the left labia. No erythema or tenderness in the vagina. No vaginal discharge found. Right adnexum does not display mass and does not display tenderness. Left adnexum does not display mass and does not display tenderness. Cervix does not exhibit motion tenderness or polyp. Uterus is not enlarged or tender. Rectal exam shows no fissure, no tenderness and guaiac negative stool.  Neck: Normal range of motion. No thyromegaly present.  Cardiovascular: Normal rate, regular rhythm and normal heart sounds.  No murmur heard. Pulmonary/Chest: Effort normal and breath  sounds normal. Right breast exhibits no mass, no nipple discharge, no skin change and no tenderness. Left breast exhibits no mass, no nipple discharge, no skin change and no tenderness.  Abdominal: Soft. There is no tenderness. There is no guarding.  Musculoskeletal: Normal range of motion.  Neurological: She is alert and oriented to person, place, and time. No cranial nerve deficit.  Psychiatric: She has a normal mood and affect. Her behavior is normal.  Vitals reviewed.   Results: Results for orders placed or performed in visit on 10/25/17 (from the past 24 hour(s))  POCT Occult Blood Stool     Status: Normal   Collection Time: 10/25/17  4:55 PM  Result Value Ref Range   Fecal Occult Blood, POC Negative Negative   Card #1 Date     Card #2 Fecal Occult Blod, POC     Card #2 Date     Card #3 Fecal Occult Blood, POC     Card #3 Date      Assessment/Plan:  Encounter for annual routine gynecological examination  Cervical cancer screening - Plan: IGP, Aptima HPV  Screening for HPV (human papillomavirus) - Plan: IGP, Aptima HPV  Screening for breast cancer - Pt followed by Dr. Bary Castilla  Screening for colon cancer - Neg FOBT today. Pt plans to sched with Dr. Bary Castilla. Also discussed cologuard. F/u prn ref. - Plan: POCT Occult Blood Stool          GYN counsel breast self exam,  mammography screening, menopause, adequate intake of calcium and vitamin D, diet and exercise    F/U  Return in about 1 year (around 10/26/2018).  Fletcher Ostermiller B. Camren Lipsett, PA-C 10/25/2017 4:55 PM

## 2017-10-25 NOTE — Patient Instructions (Signed)
I value your feedback and entrusting us with your care. If you get a [facility] patient survey, I would appreciate you taking the time to let us know about your experience today. Thank you! 

## 2017-10-28 LAB — IGP, APTIMA HPV
HPV APTIMA: NEGATIVE
PAP SMEAR COMMENT: 0

## 2018-01-30 ENCOUNTER — Other Ambulatory Visit: Payer: Self-pay

## 2018-01-30 DIAGNOSIS — Z171 Estrogen receptor negative status [ER-]: Principal | ICD-10-CM

## 2018-01-30 DIAGNOSIS — C50312 Malignant neoplasm of lower-inner quadrant of left female breast: Secondary | ICD-10-CM

## 2018-01-31 ENCOUNTER — Other Ambulatory Visit: Payer: Self-pay

## 2018-01-31 DIAGNOSIS — C50312 Malignant neoplasm of lower-inner quadrant of left female breast: Secondary | ICD-10-CM

## 2018-01-31 DIAGNOSIS — Z171 Estrogen receptor negative status [ER-]: Principal | ICD-10-CM

## 2018-02-07 ENCOUNTER — Other Ambulatory Visit: Payer: Self-pay

## 2018-02-07 DIAGNOSIS — C50312 Malignant neoplasm of lower-inner quadrant of left female breast: Secondary | ICD-10-CM

## 2018-02-07 DIAGNOSIS — Z171 Estrogen receptor negative status [ER-]: Principal | ICD-10-CM

## 2018-02-20 ENCOUNTER — Ambulatory Visit
Admission: RE | Admit: 2018-02-20 | Discharge: 2018-02-20 | Disposition: A | Discharge status: Home / Self Care | Source: Ambulatory Visit | Attending: General Surgery | Admitting: General Surgery

## 2018-02-20 ENCOUNTER — Ambulatory Visit: Payer: TRICARE For Life (TFL)

## 2018-02-20 DIAGNOSIS — Z171 Estrogen receptor negative status [ER-]: Principal | ICD-10-CM

## 2018-02-20 DIAGNOSIS — C50312 Malignant neoplasm of lower-inner quadrant of left female breast: Secondary | ICD-10-CM | POA: Insufficient documentation

## 2018-02-24 ENCOUNTER — Ambulatory Visit: Payer: TRICARE For Life (TFL)

## 2018-02-24 ENCOUNTER — Other Ambulatory Visit: Payer: TRICARE For Life (TFL)

## 2018-03-07 ENCOUNTER — Ambulatory Visit: Admitting: General Surgery

## 2018-04-04 ENCOUNTER — Ambulatory Visit: Admitting: General Surgery

## 2018-05-30 ENCOUNTER — Encounter: Payer: Self-pay | Admitting: *Deleted

## 2018-11-20 ENCOUNTER — Other Ambulatory Visit: Payer: Self-pay | Admitting: *Deleted

## 2018-11-20 DIAGNOSIS — C50312 Malignant neoplasm of lower-inner quadrant of left female breast: Secondary | ICD-10-CM

## 2018-11-20 DIAGNOSIS — Z171 Estrogen receptor negative status [ER-]: Secondary | ICD-10-CM

## 2018-12-12 ENCOUNTER — Encounter: Payer: Self-pay | Admitting: General Surgery

## 2019-02-22 ENCOUNTER — Other Ambulatory Visit: Payer: Self-pay

## 2019-02-28 ENCOUNTER — Other Ambulatory Visit: Payer: Self-pay | Admitting: General Surgery

## 2019-02-28 DIAGNOSIS — C50312 Malignant neoplasm of lower-inner quadrant of left female breast: Secondary | ICD-10-CM

## 2019-03-02 ENCOUNTER — Ambulatory Visit: Admitting: Surgery

## 2019-03-03 ENCOUNTER — Other Ambulatory Visit: Payer: Self-pay | Admitting: General Surgery

## 2019-03-03 DIAGNOSIS — Z171 Estrogen receptor negative status [ER-]: Secondary | ICD-10-CM

## 2019-03-03 DIAGNOSIS — C50312 Malignant neoplasm of lower-inner quadrant of left female breast: Secondary | ICD-10-CM

## 2019-03-29 ENCOUNTER — Ambulatory Visit
Admission: RE | Admit: 2019-03-29 | Discharge: 2019-03-29 | Disposition: A | Discharge status: Home / Self Care | Source: Ambulatory Visit | Attending: General Surgery | Admitting: General Surgery

## 2019-03-29 DIAGNOSIS — C50312 Malignant neoplasm of lower-inner quadrant of left female breast: Secondary | ICD-10-CM | POA: Diagnosis not present

## 2019-03-29 DIAGNOSIS — Z171 Estrogen receptor negative status [ER-]: Secondary | ICD-10-CM

## 2019-03-29 HISTORY — DX: Personal history of irradiation: Z92.3

## 2019-06-06 ENCOUNTER — Encounter: Payer: Self-pay | Admitting: *Deleted

## 2019-10-10 DIAGNOSIS — Z79899 Other long term (current) drug therapy: Secondary | ICD-10-CM | POA: Diagnosis not present

## 2019-10-10 DIAGNOSIS — R5383 Other fatigue: Secondary | ICD-10-CM | POA: Diagnosis not present

## 2019-10-10 DIAGNOSIS — C50919 Malignant neoplasm of unspecified site of unspecified female breast: Secondary | ICD-10-CM | POA: Diagnosis not present

## 2019-10-10 DIAGNOSIS — M0589 Other rheumatoid arthritis with rheumatoid factor of multiple sites: Secondary | ICD-10-CM | POA: Diagnosis not present

## 2019-11-16 DIAGNOSIS — L570 Actinic keratosis: Secondary | ICD-10-CM | POA: Diagnosis not present

## 2019-11-16 DIAGNOSIS — L814 Other melanin hyperpigmentation: Secondary | ICD-10-CM | POA: Diagnosis not present

## 2019-11-16 DIAGNOSIS — L853 Xerosis cutis: Secondary | ICD-10-CM | POA: Diagnosis not present

## 2019-11-16 DIAGNOSIS — D485 Neoplasm of uncertain behavior of skin: Secondary | ICD-10-CM | POA: Diagnosis not present

## 2019-11-16 DIAGNOSIS — L438 Other lichen planus: Secondary | ICD-10-CM | POA: Diagnosis not present

## 2019-11-16 DIAGNOSIS — L821 Other seborrheic keratosis: Secondary | ICD-10-CM | POA: Diagnosis not present

## 2019-11-16 DIAGNOSIS — L720 Epidermal cyst: Secondary | ICD-10-CM | POA: Diagnosis not present

## 2019-11-22 DIAGNOSIS — Z1211 Encounter for screening for malignant neoplasm of colon: Secondary | ICD-10-CM

## 2019-11-22 HISTORY — DX: Encounter for screening for malignant neoplasm of colon: Z12.11

## 2019-12-04 ENCOUNTER — Other Ambulatory Visit: Payer: Self-pay

## 2019-12-04 ENCOUNTER — Ambulatory Visit (INDEPENDENT_AMBULATORY_CARE_PROVIDER_SITE_OTHER): Payer: BC Managed Care – PPO | Admitting: Obstetrics and Gynecology

## 2019-12-04 ENCOUNTER — Other Ambulatory Visit (HOSPITAL_COMMUNITY)
Admission: RE | Admit: 2019-12-04 | Discharge: 2019-12-04 | Disposition: A | Discharge status: Home / Self Care | Payer: BC Managed Care – PPO | Source: Ambulatory Visit | Attending: Obstetrics and Gynecology | Admitting: Obstetrics and Gynecology

## 2019-12-04 ENCOUNTER — Encounter: Payer: Self-pay | Admitting: Obstetrics and Gynecology

## 2019-12-04 VITALS — BP 110/80 | Ht 72.0 in | Wt 205.0 lb

## 2019-12-04 DIAGNOSIS — Z124 Encounter for screening for malignant neoplasm of cervix: Secondary | ICD-10-CM | POA: Insufficient documentation

## 2019-12-04 DIAGNOSIS — Z01419 Encounter for gynecological examination (general) (routine) without abnormal findings: Secondary | ICD-10-CM

## 2019-12-04 DIAGNOSIS — Z1231 Encounter for screening mammogram for malignant neoplasm of breast: Secondary | ICD-10-CM | POA: Diagnosis not present

## 2019-12-04 DIAGNOSIS — C50312 Malignant neoplasm of lower-inner quadrant of left female breast: Secondary | ICD-10-CM | POA: Diagnosis not present

## 2019-12-04 DIAGNOSIS — Z171 Estrogen receptor negative status [ER-]: Secondary | ICD-10-CM

## 2019-12-04 DIAGNOSIS — Z1211 Encounter for screening for malignant neoplasm of colon: Secondary | ICD-10-CM

## 2019-12-04 NOTE — Progress Notes (Signed)
PCP: Merrilee Seashore, MD   Chief Complaint  Patient presents with   Gynecologic Exam    HPI:      [name] is a 54 y.o. G1P1 who LMP was Patient's last menstrual period was 07/01/2013., presents today for her annual examination.  Her menses are absent due to menopause. She does not have intermenstrual bleeding. She does have vasomotor sx that are significant but tolerable. Declines effexor, can't have HRT.  Sex activity: single partner, contraception - post menopausal status. She does not have vaginal dryness.  Last Pap: 10/25/17  Results were: no abnormalities /neg HPV DNA. On methotrexate so needs yearly paps.  Hx of STDs: none  Last mammogram: 03/29/19  Results were: normal--routine follow-up in 12 months. Followed by DR. Byrnett There is no FH of breast cancer. There is no FH of ovarian cancer. Pt is s/p LT breast cancer 2015 with lumpectomy and radiation. Had neg Myriad genetic testing with Dr. Bary Castilla in 2015. The patient does do self-breast exams.  Colonoscopy: never; discussed with Dr. Bary Castilla. Wants to do cologuard first.  Tobacco use: The patient denies current or previous tobacco use. Alcohol use: social drinker  No drug use Exercise: min active; difficult with RA. Having flare currently.   Had DEXA recently with rheumatology. Suggested bisphosphonate but pt hesitant.  She does get adequate calcium and Vitamin D in her diet.  Labs with PCP.   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   Fall 09-24-13   H/O fibromyalgia    Personal history of radiation therapy    Thyroid disease     Past Surgical History:  Procedure Laterality Date   BREAST LUMPECTOMY Left 2015   BREAST SURGERY Left 09-17-13   INVASIVE  MAMMARY CARCINOMA OF NO SPECIAL TYPE,/DCIS    BREAST SURGERY Left 10/18/13   Wide excision, mastoplasty. Sentinel node biopsy.   CYST REMOVAL LEG Right 2000   SALPINGOOPHORECTOMY     dermoid cyst RT    Family History  Problem Relation Age of Onset   Cancer Mother 37       bladder and skin   Breast cancer Neg Hx     Social History   Socioeconomic History   Marital status: Single    Spouse name: Not on file   Number of children: Not on file   Years of education: Not on file   Highest education level: Not on file  Occupational History   Not on file  Tobacco Use   Smoking status: Former Smoker    Packs/day: 1.00    Years: 20.00    Pack years: 20.00    Types: Cigarettes   Smokeless tobacco: Never Used  Scientific laboratory technician Use: Never used  Substance and Sexual Activity   Alcohol use: Yes   Drug use: No   Sexual activity: Yes    Birth control/protection: Post-menopausal  Other Topics Concern   Not on file  Social History Narrative   Not on file   Social Determinants of Health   Financial Resource Strain:    Difficulty of Paying Living Expenses:   Food Insecurity:    Worried About [place] in the Last Year:  Ran Out of Food in the Last Year:   Transportation Needs:    Film/video editor (Medical):    Lack of Transportation (Non-Medical):   Physical Activity:    Days of Exercise per Week:    Minutes of Exercise per Session:   Stress:    Feeling of Stress :   Social Connections:    Frequency of Communication with Friends and Family:    Frequency of Social Gatherings with Friends and Family:    Attends Religious Services:    Active Member of Clubs or Organizations:    Attends Music therapist:    Marital Status:   Intimate Partner Violence:    Fear of Current or Ex-Partner:    Emotionally Abused:    Physically Abused:    Sexually Abused:     Outpatient Medications Prior to Visit  Medication  Sig Dispense Refill   folic acid (FOLVITE) 1 MG tablet Take 1 mg by mouth daily.     meloxicam (MOBIC) 7.5 MG tablet Take 7.5 mg by mouth daily.     methotrexate (RHEUMATREX) 2.5 MG tablet Take 2.5 mg by mouth once a week. Caution:Chemotherapy. Protect from light.     predniSONE (DELTASONE) 10 MG tablet Take by mouth.     predniSONE (DELTASONE) 5 MG tablet prednisone 5 mg tablet   1 tablet every day by oral route.     traMADol (ULTRAM) 50 MG tablet Take 50 mg by mouth every 12 (twelve) hours as needed.      vitamin B-12 (CYANOCOBALAMIN) 1000 MCG tablet Take 1,000 mcg by mouth daily.     Vitamin D, Ergocalciferol, (DRISDOL) 50000 units CAPS capsule Take 50,000 Units by mouth every 7 (seven) days.     minocycline (MINOCIN,DYNACIN) 50 MG capsule Take 50 mg by mouth 2 (two) times daily.      predniSONE (DELTASONE) 2.5 MG tablet prednisone 2.5 mg tablet  Take 1 tablet every other day by oral route as directed.     predniSONE (STERAPRED UNI-PAK) 5 MG TABS tablet Take 2.5 mg by mouth daily.      No facility-administered medications prior to visit.       ROS:  Review of Systems  Constitutional: Positive for fatigue. Negative for fever and unexpected weight change.  Respiratory: Negative for cough, shortness of breath and wheezing.   Cardiovascular: Negative for chest pain, palpitations and leg swelling.  Gastrointestinal: Negative for blood in stool, constipation, diarrhea, nausea and vomiting.  Endocrine: Negative for cold intolerance, heat intolerance and polyuria.  Genitourinary: Negative for dyspareunia, dysuria, flank pain, frequency, genital sores, hematuria, menstrual problem, pelvic pain, urgency, vaginal bleeding, vaginal discharge and vaginal pain.  Musculoskeletal: Negative for back pain, joint swelling and myalgias.  Skin: Negative for rash.  Neurological: Negative for dizziness, syncope, light-headedness, numbness and headaches.  Hematological: Negative for  adenopathy.  Psychiatric/Behavioral: Negative for agitation, confusion, sleep disturbance and suicidal ideas. The patient is not nervous/anxious.    BREAST: No symptoms    Objective: BP 110/80    Ht 6' (1.829 m)    Wt 205 lb (93 kg)    LMP 07/01/2013    BMI 27.80 kg/m    Physical Exam Constitutional:      Appearance: She is well-developed.  Genitourinary:     Vulva, vagina, uterus, right adnexa, left adnexa and rectum normal.     No vulval lesion or tenderness noted.     No vaginal discharge, erythema or tenderness.     No cervical motion tenderness  or polyp.     Uterus is not enlarged or tender.     No right or left adnexal mass present.     Right adnexa not tender.     Left adnexa not tender.  Rectum:     Guaiac result negative.     No anal fissure or tenderness.  Neck:     Thyroid: No thyromegaly.  Cardiovascular:     Rate and Rhythm: Normal rate and regular rhythm.     Heart sounds: Normal heart sounds. No murmur heard.   Pulmonary:     Effort: Pulmonary effort is normal.     Breath sounds: Normal breath sounds.  Chest:     Breasts:        Right: No mass, nipple discharge, skin change or tenderness.        Left: No mass, nipple discharge, skin change or tenderness.  Abdominal:     Palpations: Abdomen is soft.     Tenderness: There is no abdominal tenderness. There is no guarding.  Musculoskeletal:        General: Normal range of motion.     Cervical back: Normal range of motion.  Neurological:     General: No focal deficit present.     Mental Status: She is alert and oriented to person, place, and time.     Cranial Nerves: No cranial nerve deficit.  Skin:    General: Skin is warm and dry.  Psychiatric:        Mood and Affect: Mood normal.        Behavior: Behavior normal.        Thought Content: Thought content normal.        Judgment: Judgment normal.  Vitals reviewed.     Assessment/Plan:  Encounter for annual routine gynecological  examination  Cervical cancer screening - Plan: Cytology - PAP  Encounter for screening mammogram for malignant neoplasm of breast - Plan: MM 3D SCREEN BREAST BILATERAL; pt to sched mammo  Malignant neoplasm of lower-inner quadrant of left breast in female, estrogen receptor negative ([place])--  Screening for colon cancer - Plan: Cologuard; ref sent. Will f/u with results.           GYN counsel breast self exam, mammography screening, menopause, adequate intake of calcium and vitamin D, diet and exercise    F/U  Return in about 1 year (around 12/03/2020).  Shilpa Bushee B. Tyreak Reagle, PA-C 12/04/2019 2:50 PM

## 2019-12-04 NOTE — Patient Instructions (Signed)
I value your feedback and entrusting us with your care. If you get a [facility] patient survey, I would appreciate you taking the time to let us know about your experience today. Thank you!  As of May 03, 2019, your lab results will be released to your MyChart immediately, before I even have a chance to see them. Please give me time to review them and contact you if there are any abnormalities. Thank you for your patience.  

## 2019-12-06 LAB — CYTOLOGY - PAP: Diagnosis: NEGATIVE

## 2019-12-18 ENCOUNTER — Encounter: Payer: Self-pay | Admitting: Obstetrics and Gynecology

## 2019-12-18 DIAGNOSIS — Z1211 Encounter for screening for malignant neoplasm of colon: Secondary | ICD-10-CM | POA: Diagnosis not present

## 2019-12-18 LAB — COLOGUARD: Cologuard: NEGATIVE

## 2019-12-25 LAB — EXTERNAL GENERIC LAB PROCEDURE: COLOGUARD: NEGATIVE

## 2019-12-25 LAB — COLOGUARD: COLOGUARD: NEGATIVE

## 2019-12-26 ENCOUNTER — Telehealth: Payer: Self-pay

## 2019-12-26 ENCOUNTER — Encounter: Payer: Self-pay | Admitting: Obstetrics and Gynecology

## 2019-12-26 NOTE — Telephone Encounter (Signed)
Patient returning missed call please advise

## 2019-12-26 NOTE — Telephone Encounter (Signed)
Per ABC, called pt to let her know NEGATIVE results. No answer, LVMTRC. 

## 2019-12-26 NOTE — Telephone Encounter (Signed)
Pt aware.

## 2019-12-27 DIAGNOSIS — M0589 Other rheumatoid arthritis with rheumatoid factor of multiple sites: Secondary | ICD-10-CM | POA: Diagnosis not present

## 2019-12-27 DIAGNOSIS — R5383 Other fatigue: Secondary | ICD-10-CM | POA: Diagnosis not present

## 2019-12-27 DIAGNOSIS — C50919 Malignant neoplasm of unspecified site of unspecified female breast: Secondary | ICD-10-CM | POA: Diagnosis not present

## 2019-12-27 DIAGNOSIS — Z79899 Other long term (current) drug therapy: Secondary | ICD-10-CM | POA: Diagnosis not present

## 2020-01-10 DIAGNOSIS — Y999 Unspecified external cause status: Secondary | ICD-10-CM | POA: Diagnosis not present

## 2020-01-10 DIAGNOSIS — Y9301 Activity, walking, marching and hiking: Secondary | ICD-10-CM | POA: Diagnosis not present

## 2020-01-10 DIAGNOSIS — S42291A Other displaced fracture of upper end of right humerus, initial encounter for closed fracture: Secondary | ICD-10-CM | POA: Diagnosis not present

## 2020-01-10 DIAGNOSIS — S42201A Unspecified fracture of upper end of right humerus, initial encounter for closed fracture: Secondary | ICD-10-CM | POA: Diagnosis not present

## 2020-01-10 DIAGNOSIS — R402412 Glasgow coma scale score 13-15, at arrival to emergency department: Secondary | ICD-10-CM | POA: Diagnosis not present

## 2020-01-10 DIAGNOSIS — W19XXXA Unspecified fall, initial encounter: Secondary | ICD-10-CM | POA: Diagnosis not present

## 2020-01-10 DIAGNOSIS — S42221A 2-part displaced fracture of surgical neck of right humerus, initial encounter for closed fracture: Secondary | ICD-10-CM | POA: Diagnosis not present

## 2020-01-10 DIAGNOSIS — Y929 Unspecified place or not applicable: Secondary | ICD-10-CM | POA: Diagnosis not present

## 2020-01-10 DIAGNOSIS — Z743 Need for continuous supervision: Secondary | ICD-10-CM | POA: Diagnosis not present

## 2020-01-10 DIAGNOSIS — W1830XA Fall on same level, unspecified, initial encounter: Secondary | ICD-10-CM | POA: Diagnosis not present

## 2020-01-10 DIAGNOSIS — M25511 Pain in right shoulder: Secondary | ICD-10-CM | POA: Diagnosis not present

## 2020-01-10 DIAGNOSIS — S4990XA Unspecified injury of shoulder and upper arm, unspecified arm, initial encounter: Secondary | ICD-10-CM | POA: Diagnosis not present

## 2020-01-17 DIAGNOSIS — M25511 Pain in right shoulder: Secondary | ICD-10-CM | POA: Diagnosis not present

## 2020-01-17 DIAGNOSIS — S42201D Unspecified fracture of upper end of right humerus, subsequent encounter for fracture with routine healing: Secondary | ICD-10-CM | POA: Diagnosis not present

## 2020-01-25 DIAGNOSIS — M25511 Pain in right shoulder: Secondary | ICD-10-CM | POA: Diagnosis not present

## 2020-02-07 DIAGNOSIS — M25511 Pain in right shoulder: Secondary | ICD-10-CM | POA: Diagnosis not present

## 2020-02-22 DIAGNOSIS — M25511 Pain in right shoulder: Secondary | ICD-10-CM | POA: Diagnosis not present

## 2020-02-26 DIAGNOSIS — M25511 Pain in right shoulder: Secondary | ICD-10-CM | POA: Diagnosis not present

## 2020-02-28 ENCOUNTER — Other Ambulatory Visit: Payer: Self-pay | Admitting: General Surgery

## 2020-02-28 DIAGNOSIS — C50312 Malignant neoplasm of lower-inner quadrant of left female breast: Secondary | ICD-10-CM

## 2020-03-14 DIAGNOSIS — M25511 Pain in right shoulder: Secondary | ICD-10-CM | POA: Diagnosis not present

## 2020-03-18 DIAGNOSIS — M25511 Pain in right shoulder: Secondary | ICD-10-CM | POA: Diagnosis not present

## 2020-03-21 DIAGNOSIS — M25511 Pain in right shoulder: Secondary | ICD-10-CM | POA: Diagnosis not present

## 2020-03-25 DIAGNOSIS — M25511 Pain in right shoulder: Secondary | ICD-10-CM | POA: Diagnosis not present

## 2020-03-26 DIAGNOSIS — Z79899 Other long term (current) drug therapy: Secondary | ICD-10-CM | POA: Diagnosis not present

## 2020-03-26 DIAGNOSIS — M0589 Other rheumatoid arthritis with rheumatoid factor of multiple sites: Secondary | ICD-10-CM | POA: Diagnosis not present

## 2020-03-26 DIAGNOSIS — R5383 Other fatigue: Secondary | ICD-10-CM | POA: Diagnosis not present

## 2020-03-26 DIAGNOSIS — C50919 Malignant neoplasm of unspecified site of unspecified female breast: Secondary | ICD-10-CM | POA: Diagnosis not present

## 2020-04-01 DIAGNOSIS — M25511 Pain in right shoulder: Secondary | ICD-10-CM | POA: Diagnosis not present

## 2020-04-07 ENCOUNTER — Other Ambulatory Visit: Payer: Self-pay

## 2020-04-07 ENCOUNTER — Ambulatory Visit
Admission: RE | Admit: 2020-04-07 | Discharge: 2020-04-07 | Disposition: A | Discharge status: Home / Self Care | Payer: BC Managed Care – PPO | Source: Ambulatory Visit | Attending: General Surgery | Admitting: General Surgery

## 2020-04-07 DIAGNOSIS — Z1231 Encounter for screening mammogram for malignant neoplasm of breast: Secondary | ICD-10-CM | POA: Insufficient documentation

## 2020-04-07 DIAGNOSIS — Z171 Estrogen receptor negative status [ER-]: Secondary | ICD-10-CM | POA: Diagnosis not present

## 2020-04-07 DIAGNOSIS — C50312 Malignant neoplasm of lower-inner quadrant of left female breast: Secondary | ICD-10-CM | POA: Diagnosis not present

## 2020-04-08 DIAGNOSIS — M25511 Pain in right shoulder: Secondary | ICD-10-CM | POA: Diagnosis not present

## 2020-04-10 ENCOUNTER — Other Ambulatory Visit: Payer: Self-pay | Admitting: General Surgery

## 2020-04-10 DIAGNOSIS — R928 Other abnormal and inconclusive findings on diagnostic imaging of breast: Secondary | ICD-10-CM

## 2020-04-14 ENCOUNTER — Telehealth: Payer: Self-pay | Admitting: *Deleted

## 2020-04-14 NOTE — Telephone Encounter (Signed)
VM TO PT TO SCHD AV/MAMMO

## 2020-04-21 ENCOUNTER — Other Ambulatory Visit: Payer: Self-pay

## 2020-04-21 ENCOUNTER — Ambulatory Visit
Admission: RE | Admit: 2020-04-21 | Discharge: 2020-04-21 | Disposition: A | Discharge status: Home / Self Care | Payer: BC Managed Care – PPO | Source: Ambulatory Visit | Attending: General Surgery | Admitting: General Surgery

## 2020-04-21 DIAGNOSIS — R928 Other abnormal and inconclusive findings on diagnostic imaging of breast: Secondary | ICD-10-CM | POA: Insufficient documentation

## 2020-04-22 DIAGNOSIS — M25511 Pain in right shoulder: Secondary | ICD-10-CM | POA: Diagnosis not present

## 2020-04-29 DIAGNOSIS — M25511 Pain in right shoulder: Secondary | ICD-10-CM | POA: Diagnosis not present

## 2020-05-06 DIAGNOSIS — M25511 Pain in right shoulder: Secondary | ICD-10-CM | POA: Diagnosis not present

## 2020-05-13 DIAGNOSIS — M25511 Pain in right shoulder: Secondary | ICD-10-CM | POA: Diagnosis not present

## 2020-06-02 DIAGNOSIS — M25562 Pain in left knee: Secondary | ICD-10-CM | POA: Diagnosis not present

## 2020-06-02 DIAGNOSIS — M069 Rheumatoid arthritis, unspecified: Secondary | ICD-10-CM | POA: Diagnosis not present

## 2020-06-05 DIAGNOSIS — Z853 Personal history of malignant neoplasm of breast: Secondary | ICD-10-CM | POA: Diagnosis not present

## 2020-07-07 DIAGNOSIS — C50919 Malignant neoplasm of unspecified site of unspecified female breast: Secondary | ICD-10-CM | POA: Diagnosis not present

## 2020-07-07 DIAGNOSIS — R5383 Other fatigue: Secondary | ICD-10-CM | POA: Diagnosis not present

## 2020-07-07 DIAGNOSIS — M0589 Other rheumatoid arthritis with rheumatoid factor of multiple sites: Secondary | ICD-10-CM | POA: Diagnosis not present

## 2020-07-07 DIAGNOSIS — Z79899 Other long term (current) drug therapy: Secondary | ICD-10-CM | POA: Diagnosis not present

## 2020-08-15 DIAGNOSIS — M0589 Other rheumatoid arthritis with rheumatoid factor of multiple sites: Secondary | ICD-10-CM | POA: Diagnosis not present

## 2020-08-15 DIAGNOSIS — R5383 Other fatigue: Secondary | ICD-10-CM | POA: Diagnosis not present

## 2020-08-15 DIAGNOSIS — Z79899 Other long term (current) drug therapy: Secondary | ICD-10-CM | POA: Diagnosis not present

## 2020-08-15 DIAGNOSIS — C50919 Malignant neoplasm of unspecified site of unspecified female breast: Secondary | ICD-10-CM | POA: Diagnosis not present

## 2020-09-12 DIAGNOSIS — M0589 Other rheumatoid arthritis with rheumatoid factor of multiple sites: Secondary | ICD-10-CM | POA: Diagnosis not present

## 2020-09-12 DIAGNOSIS — C50919 Malignant neoplasm of unspecified site of unspecified female breast: Secondary | ICD-10-CM | POA: Diagnosis not present

## 2020-09-12 DIAGNOSIS — R5383 Other fatigue: Secondary | ICD-10-CM | POA: Diagnosis not present

## 2020-09-12 DIAGNOSIS — Z79899 Other long term (current) drug therapy: Secondary | ICD-10-CM | POA: Diagnosis not present

## 2020-10-02 DIAGNOSIS — M25531 Pain in right wrist: Secondary | ICD-10-CM | POA: Diagnosis not present

## 2020-10-02 DIAGNOSIS — Z79899 Other long term (current) drug therapy: Secondary | ICD-10-CM | POA: Diagnosis not present

## 2020-10-02 DIAGNOSIS — M0589 Other rheumatoid arthritis with rheumatoid factor of multiple sites: Secondary | ICD-10-CM | POA: Diagnosis not present

## 2020-10-02 DIAGNOSIS — R5383 Other fatigue: Secondary | ICD-10-CM | POA: Diagnosis not present

## 2020-11-04 DIAGNOSIS — Z79899 Other long term (current) drug therapy: Secondary | ICD-10-CM | POA: Diagnosis not present

## 2020-11-04 DIAGNOSIS — M79671 Pain in right foot: Secondary | ICD-10-CM | POA: Diagnosis not present

## 2020-11-04 DIAGNOSIS — M25531 Pain in right wrist: Secondary | ICD-10-CM | POA: Diagnosis not present

## 2020-11-04 DIAGNOSIS — M0589 Other rheumatoid arthritis with rheumatoid factor of multiple sites: Secondary | ICD-10-CM | POA: Diagnosis not present

## 2020-11-04 DIAGNOSIS — M797 Fibromyalgia: Secondary | ICD-10-CM | POA: Diagnosis not present

## 2020-11-18 DIAGNOSIS — M0589 Other rheumatoid arthritis with rheumatoid factor of multiple sites: Secondary | ICD-10-CM | POA: Diagnosis not present

## 2020-11-19 DIAGNOSIS — D224 Melanocytic nevi of scalp and neck: Secondary | ICD-10-CM | POA: Diagnosis not present

## 2020-11-19 DIAGNOSIS — D225 Melanocytic nevi of trunk: Secondary | ICD-10-CM | POA: Diagnosis not present

## 2020-11-19 DIAGNOSIS — I788 Other diseases of capillaries: Secondary | ICD-10-CM | POA: Diagnosis not present

## 2020-11-19 DIAGNOSIS — L821 Other seborrheic keratosis: Secondary | ICD-10-CM | POA: Diagnosis not present

## 2020-11-19 DIAGNOSIS — L570 Actinic keratosis: Secondary | ICD-10-CM | POA: Diagnosis not present

## 2020-12-11 DIAGNOSIS — R011 Cardiac murmur, unspecified: Secondary | ICD-10-CM | POA: Diagnosis not present

## 2020-12-11 DIAGNOSIS — J00 Acute nasopharyngitis [common cold]: Secondary | ICD-10-CM | POA: Diagnosis not present

## 2020-12-22 DIAGNOSIS — J189 Pneumonia, unspecified organism: Secondary | ICD-10-CM | POA: Diagnosis not present

## 2020-12-22 DIAGNOSIS — U071 COVID-19: Secondary | ICD-10-CM | POA: Diagnosis not present

## 2020-12-24 DIAGNOSIS — J22 Unspecified acute lower respiratory infection: Secondary | ICD-10-CM | POA: Diagnosis not present

## 2020-12-24 DIAGNOSIS — H6981 Other specified disorders of Eustachian tube, right ear: Secondary | ICD-10-CM | POA: Diagnosis not present

## 2020-12-24 DIAGNOSIS — H8110 Benign paroxysmal vertigo, unspecified ear: Secondary | ICD-10-CM | POA: Diagnosis not present

## 2020-12-24 DIAGNOSIS — R112 Nausea with vomiting, unspecified: Secondary | ICD-10-CM | POA: Diagnosis not present

## 2021-01-13 DIAGNOSIS — J019 Acute sinusitis, unspecified: Secondary | ICD-10-CM | POA: Diagnosis not present

## 2021-01-13 DIAGNOSIS — B9689 Other specified bacterial agents as the cause of diseases classified elsewhere: Secondary | ICD-10-CM | POA: Diagnosis not present

## 2021-01-13 DIAGNOSIS — Z20822 Contact with and (suspected) exposure to covid-19: Secondary | ICD-10-CM | POA: Diagnosis not present

## 2021-02-18 DIAGNOSIS — M79642 Pain in left hand: Secondary | ICD-10-CM | POA: Diagnosis not present

## 2021-02-18 DIAGNOSIS — Z79899 Other long term (current) drug therapy: Secondary | ICD-10-CM | POA: Diagnosis not present

## 2021-02-18 DIAGNOSIS — M0589 Other rheumatoid arthritis with rheumatoid factor of multiple sites: Secondary | ICD-10-CM | POA: Diagnosis not present

## 2021-02-18 DIAGNOSIS — M797 Fibromyalgia: Secondary | ICD-10-CM | POA: Diagnosis not present

## 2021-03-04 ENCOUNTER — Other Ambulatory Visit: Payer: Self-pay | Admitting: General Surgery

## 2021-03-04 DIAGNOSIS — Z171 Estrogen receptor negative status [ER-]: Secondary | ICD-10-CM

## 2021-03-18 ENCOUNTER — Other Ambulatory Visit (HOSPITAL_COMMUNITY)
Admission: RE | Admit: 2021-03-18 | Discharge: 2021-03-18 | Disposition: A | Discharge status: Home / Self Care | Payer: BC Managed Care – PPO | Source: Ambulatory Visit | Attending: Obstetrics and Gynecology | Admitting: Obstetrics and Gynecology

## 2021-03-18 ENCOUNTER — Other Ambulatory Visit: Payer: Self-pay

## 2021-03-18 ENCOUNTER — Encounter: Payer: Self-pay | Admitting: Obstetrics and Gynecology

## 2021-03-18 ENCOUNTER — Ambulatory Visit (INDEPENDENT_AMBULATORY_CARE_PROVIDER_SITE_OTHER): Payer: BC Managed Care – PPO | Admitting: Obstetrics and Gynecology

## 2021-03-18 VITALS — BP 144/90 | Ht 71.0 in | Wt 207.0 lb

## 2021-03-18 DIAGNOSIS — Z1231 Encounter for screening mammogram for malignant neoplasm of breast: Secondary | ICD-10-CM | POA: Diagnosis not present

## 2021-03-18 DIAGNOSIS — Z124 Encounter for screening for malignant neoplasm of cervix: Secondary | ICD-10-CM

## 2021-03-18 DIAGNOSIS — Z01419 Encounter for gynecological examination (general) (routine) without abnormal findings: Secondary | ICD-10-CM | POA: Diagnosis not present

## 2021-03-18 NOTE — Patient Instructions (Signed)
I value your feedback and you entrusting us with your care. If you get a [place] patient survey, I would appreciate you taking the time to let us know about your experience today. Thank you! ? ? ?

## 2021-03-18 NOTE — Progress Notes (Signed)
PCP: Georgianne Fick, MD   Chief Complaint  Patient presents with   Gynecologic Exam    No concerns    HPI:      Susan Singleton is a 54 y.o. G1P1 who LMP was Patient's last menstrual period was 07/01/2013., presents today for her annual examination.  Her menses are absent due to menopause. She does not have PMB. She does have vasomotor sx that are significant but tolerable; improved with retirement/healthier eating. Declines effexor, can't have HRT due to hx of breast cancer.  Sex activity: single partner, contraception - post menopausal status. She does not have vaginal dryness.  Last Pap: 12/22/19 Results were: no abnormalities /neg HPV DNA 2019. On methotrexate for RA so needs yearly paps.  Hx of STDs: none  Last mammogram: 04/21/20 Results were: normal--routine follow-up in 12 months. Followed by DR. Byrnett; has appt 11/22 There is no FH of breast cancer. There is no FH of ovarian cancer. Pt is s/p LT breast cancer 2015 with lumpectomy and radiation. Had neg [facility]riad genetic testing with Dr. Lemar Livings in 2015. The patient does self-breast exams.  Colonoscopy: never; Cologuard neg 8/21; repeat due after 3 yrs  Tobacco use: The patient denies current or previous tobacco use. Alcohol use: social drinker  No drug use Exercise: mod active; difficult with RA.   Had DEXA with rheumatology. Suggested bisphosphonate but pt hesitant.  She does get adequate calcium and Vitamin D in her diet.  Labs with PCP.   Past Medical History:  Diagnosis Date   Arthritis    BRCA negative 10/04/2013   negative/Myriad   Breast cancer (HCC) 2015   Left breast - radiation   Cancer [facility]) April 2015   BRACA negative, 2 mm ER neg, PR neg, Her 2 neu amplified cancer with extensive DCIS, T1a,N0,M0.   Fall 09-24-13   H/O fibromyalgia    Personal history of radiation therapy    Screening for colon cancer 11/2019   neg Cologuard, repeat after 3 yrs   Thyroid disease     Past Surgical History:   Procedure Laterality Date   BREAST BIOPSY Left 2015   positive   BREAST LUMPECTOMY Left 2015   BREAST SURGERY Left 09-17-13   INVASIVE MAMMARY CARCINOMA OF NO SPECIAL TYPE,/DCIS    BREAST SURGERY Left 10/18/13   Wide excision, mastoplasty. Sentinel node biopsy.   CYST REMOVAL LEG Right 2000   SALPINGOOPHORECTOMY     dermoid cyst RT    Family History  Problem Relation Age of Onset   Cancer Mother 26       bladder and skin   Cancer Father        Thoracic   Cancer Maternal Grandfather        type of skin cancer   Breast cancer Neg Hx     Social History   Socioeconomic History   Marital status: Single    Spouse name: Not on file   Number of children: Not on file   Years of education: Not on file   Highest education level: Not on file  Occupational History   Not on file  Tobacco Use   Smoking status: Former    Packs/day: 1.00    Years: 20.00    Pack years: 20.00    Types: Cigarettes   Smokeless tobacco: Never  Vaping Use   Vaping Use: Never used  Substance and Sexual Activity   Alcohol use: Yes   Drug use: No   Sexual activity: Yes  Birth control/protection: Post-menopausal  Other Topics Concern   Not on file  Social History Narrative   Not on file   Social Determinants of Health   Financial Resource Strain: Not on file  Food Insecurity: Not on file  Transportation Needs: Not on file  Physical Activity: Not on file  Stress: Not on file  Social Connections: Not on file  Intimate Partner Violence: Not on file    Outpatient Medications Prior to Visit  Medication Sig Dispense Refill   folic acid (FOLVITE) 1 MG tablet Take 1 mg by mouth daily.     ibuprofen (ADVIL) 600 MG tablet Take 600 mg by mouth 3 (three) times daily.     methotrexate (RHEUMATREX) 2.5 MG tablet Take 2.5 mg by mouth once a week. Caution:Chemotherapy. Protect from light.     predniSONE (DELTASONE) 5 MG tablet prednisone 5 mg tablet   1 tablet every day by oral route.     traMADol  (ULTRAM) 50 MG tablet Take 50 mg by mouth every 12 (twelve) hours as needed.      vitamin B-12 (CYANOCOBALAMIN) 1000 MCG tablet Take 1,000 mcg by mouth daily.     Vitamin D, Ergocalciferol, (DRISDOL) 50000 units CAPS capsule Take 50,000 Units by mouth every 7 (seven) days.     meloxicam (MOBIC) 7.5 MG tablet Take 7.5 mg by mouth daily.     predniSONE (DELTASONE) 10 MG tablet Take by mouth.     No facility-administered medications prior to visit.       ROS:  Review of Systems  Constitutional:  Positive for fatigue. Negative for fever and unexpected weight change.  Respiratory:  Negative for cough, shortness of breath and wheezing.   Cardiovascular:  Negative for chest pain, palpitations and leg swelling.  Gastrointestinal:  Negative for blood in stool, constipation, diarrhea, nausea and vomiting.  Endocrine: Negative for cold intolerance, heat intolerance and polyuria.  Genitourinary:  Negative for dyspareunia, dysuria, flank pain, frequency, genital sores, hematuria, menstrual problem, pelvic pain, urgency, vaginal bleeding, vaginal discharge and vaginal pain.  Musculoskeletal:  Positive for arthralgias. Negative for back pain, joint swelling and myalgias.  Skin:  Negative for rash.  Neurological:  Negative for dizziness, syncope, light-headedness, numbness and headaches.  Hematological:  Negative for adenopathy.  Psychiatric/Behavioral:  Negative for agitation, confusion, sleep disturbance and suicidal ideas. The patient is not nervous/anxious.   BREAST: No symptoms    Objective: BP (!) 144/90   Ht $R'5\' 11"'XG$  (1.803 m)   Wt 207 lb (93.9 kg)   LMP 07/01/2013   BMI 28.87 kg/m    Physical Exam Constitutional:      Appearance: She is well-developed.  Genitourinary:     Vulva and rectum normal.     Right Labia: No rash, tenderness or lesions.    Left Labia: No tenderness, lesions or rash.    No vaginal discharge, erythema or tenderness.      Right Adnexa: not tender and no  mass present.    Left Adnexa: not tender and no mass present.    No cervical motion tenderness, friability or polyp.     Uterus is not enlarged or tender.  Rectum:     Guaiac result negative.     No anal fissure or tenderness.  Breasts:    Right: No mass, nipple discharge, skin change or tenderness.     Left: No mass, nipple discharge, skin change or tenderness.  Neck:     Thyroid: No thyromegaly.  Cardiovascular:     Rate  and Rhythm: Normal rate and regular rhythm.     Heart sounds: Normal heart sounds. No murmur heard. Pulmonary:     Effort: Pulmonary effort is normal.     Breath sounds: Normal breath sounds.  Abdominal:     Palpations: Abdomen is soft.     Tenderness: There is no abdominal tenderness. There is no guarding or rebound.  Musculoskeletal:        General: Normal range of motion.     Cervical back: Normal range of motion.  Lymphadenopathy:     Cervical: No cervical adenopathy.  Neurological:     General: No focal deficit present.     Mental Status: She is alert and oriented to person, place, and time.     Cranial Nerves: No cranial nerve deficit.  Skin:    General: Skin is warm and dry.  Psychiatric:        Mood and Affect: Mood normal.        Behavior: Behavior normal.        Thought Content: Thought content normal.        Judgment: Judgment normal.  Vitals reviewed.    Assessment/Plan:  Encounter for annual routine gynecological examination  Cervical cancer screening - Plan: Cytology - PAP  Encounter for screening mammogram for malignant neoplasm of breast--has appt with Dr. Bary Castilla          GYN counsel breast self exam, mammography screening, menopause, adequate intake of calcium and vitamin D, diet and exercise    F/U  Return in about 1 year (around 03/18/2022).  Melaya Hoselton B. Shalise Rosado, PA-C 03/18/2021 2:17 PM

## 2021-03-19 LAB — CYTOLOGY - PAP: Diagnosis: NEGATIVE

## 2021-04-09 ENCOUNTER — Other Ambulatory Visit: Payer: Self-pay

## 2021-04-09 ENCOUNTER — Ambulatory Visit
Admission: RE | Admit: 2021-04-09 | Discharge: 2021-04-09 | Disposition: A | Discharge status: Home / Self Care | Payer: BC Managed Care – PPO | Source: Ambulatory Visit | Attending: General Surgery | Admitting: General Surgery

## 2021-04-09 DIAGNOSIS — Z171 Estrogen receptor negative status [ER-]: Secondary | ICD-10-CM | POA: Diagnosis not present

## 2021-04-09 DIAGNOSIS — Z1231 Encounter for screening mammogram for malignant neoplasm of breast: Secondary | ICD-10-CM | POA: Insufficient documentation

## 2021-04-09 DIAGNOSIS — C50312 Malignant neoplasm of lower-inner quadrant of left female breast: Secondary | ICD-10-CM | POA: Diagnosis not present

## 2021-04-21 DIAGNOSIS — Z853 Personal history of malignant neoplasm of breast: Secondary | ICD-10-CM | POA: Diagnosis not present

## 2021-05-04 DIAGNOSIS — Z79899 Other long term (current) drug therapy: Secondary | ICD-10-CM | POA: Diagnosis not present

## 2021-05-04 DIAGNOSIS — M069 Rheumatoid arthritis, unspecified: Secondary | ICD-10-CM | POA: Diagnosis not present

## 2021-05-04 DIAGNOSIS — M0589 Other rheumatoid arthritis with rheumatoid factor of multiple sites: Secondary | ICD-10-CM | POA: Diagnosis not present

## 2021-05-21 DIAGNOSIS — M0589 Other rheumatoid arthritis with rheumatoid factor of multiple sites: Secondary | ICD-10-CM | POA: Diagnosis not present

## 2021-06-04 DIAGNOSIS — M0589 Other rheumatoid arthritis with rheumatoid factor of multiple sites: Secondary | ICD-10-CM | POA: Diagnosis not present

## 2021-06-22 DIAGNOSIS — M0589 Other rheumatoid arthritis with rheumatoid factor of multiple sites: Secondary | ICD-10-CM | POA: Diagnosis not present

## 2021-06-22 DIAGNOSIS — Z79899 Other long term (current) drug therapy: Secondary | ICD-10-CM | POA: Diagnosis not present

## 2021-06-22 DIAGNOSIS — M79642 Pain in left hand: Secondary | ICD-10-CM | POA: Diagnosis not present

## 2021-06-22 DIAGNOSIS — M797 Fibromyalgia: Secondary | ICD-10-CM | POA: Diagnosis not present

## 2021-07-02 DIAGNOSIS — M0589 Other rheumatoid arthritis with rheumatoid factor of multiple sites: Secondary | ICD-10-CM | POA: Diagnosis not present

## 2021-08-06 DIAGNOSIS — M0589 Other rheumatoid arthritis with rheumatoid factor of multiple sites: Secondary | ICD-10-CM | POA: Diagnosis not present

## 2021-08-20 DIAGNOSIS — M797 Fibromyalgia: Secondary | ICD-10-CM | POA: Diagnosis not present

## 2021-08-20 DIAGNOSIS — M79642 Pain in left hand: Secondary | ICD-10-CM | POA: Diagnosis not present

## 2021-08-20 DIAGNOSIS — M0589 Other rheumatoid arthritis with rheumatoid factor of multiple sites: Secondary | ICD-10-CM | POA: Diagnosis not present

## 2021-08-20 DIAGNOSIS — Z79899 Other long term (current) drug therapy: Secondary | ICD-10-CM | POA: Diagnosis not present

## 2021-09-10 DIAGNOSIS — M0589 Other rheumatoid arthritis with rheumatoid factor of multiple sites: Secondary | ICD-10-CM | POA: Diagnosis not present

## 2021-09-21 IMAGING — MG DIGITAL SCREENING BILAT W/ TOMO W/ CAD
6 of 10 series · 6 of 30 positions shown · non-contrast
Comparison: Previous exam(s).

CLINICAL DATA: Screening.

EXAM:
DIGITAL SCREENING BILATERAL MAMMOGRAM WITH TOMO AND CAD

[R MLO synth-2D]
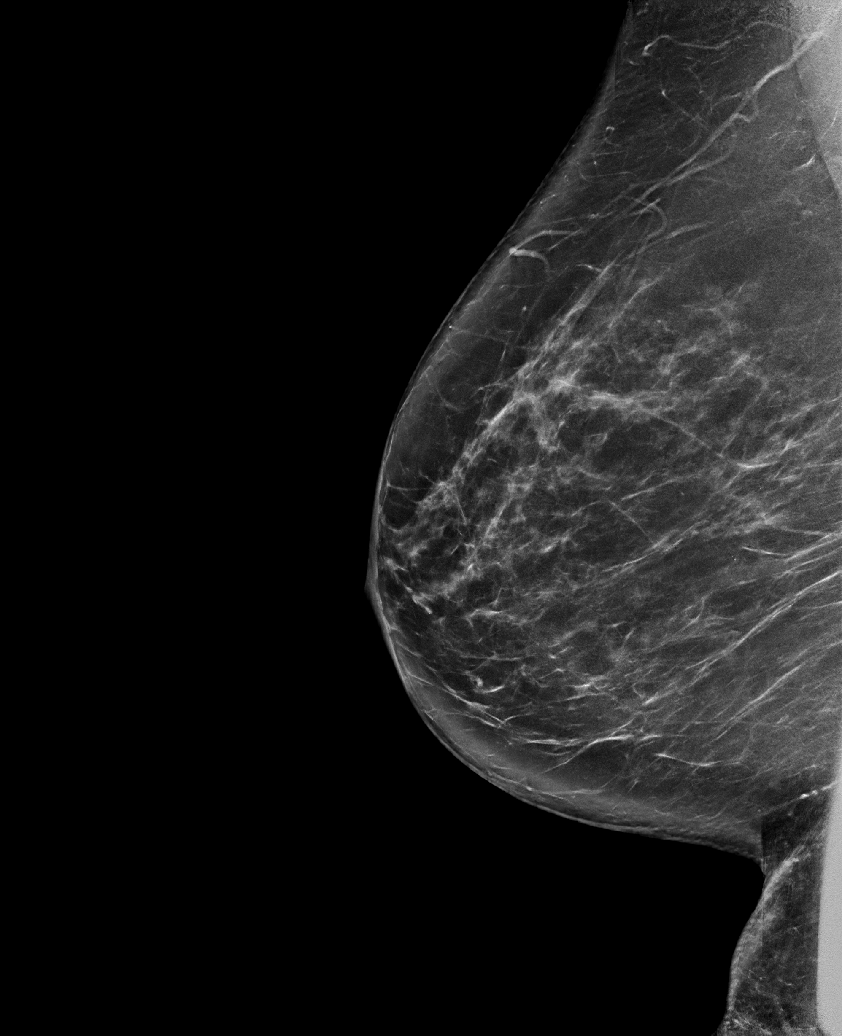

[R CC synth-2D]
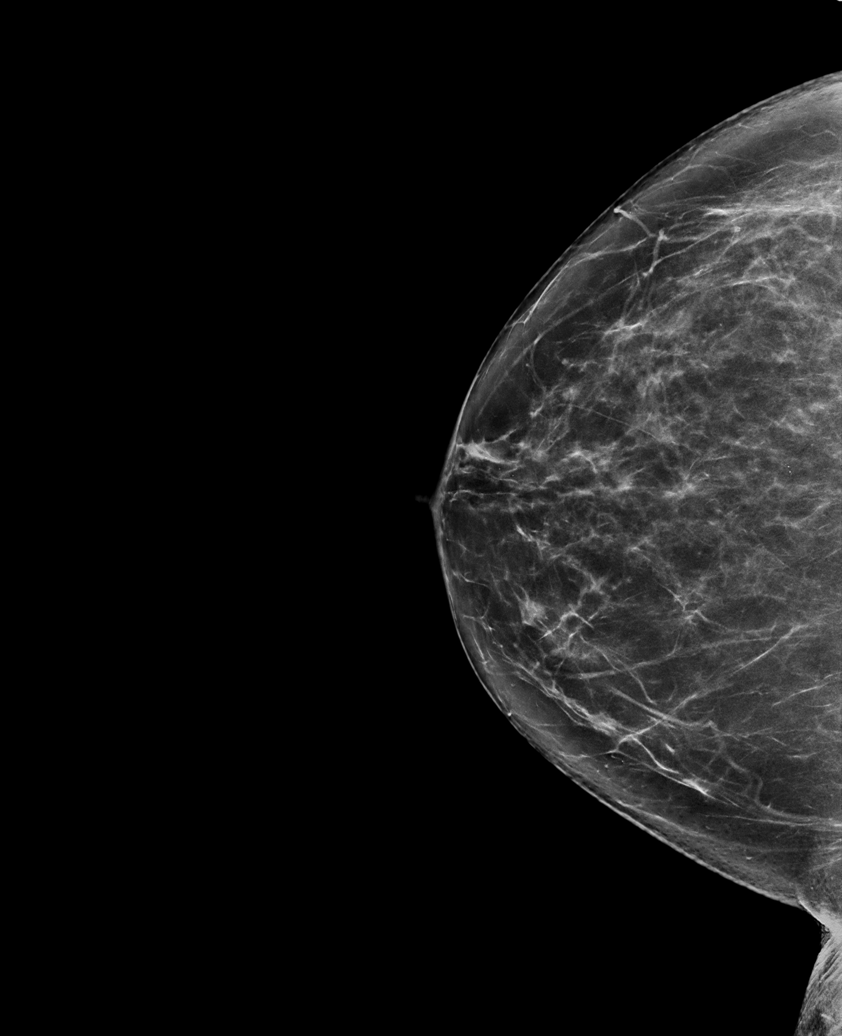

[L XCCL synth-2D]
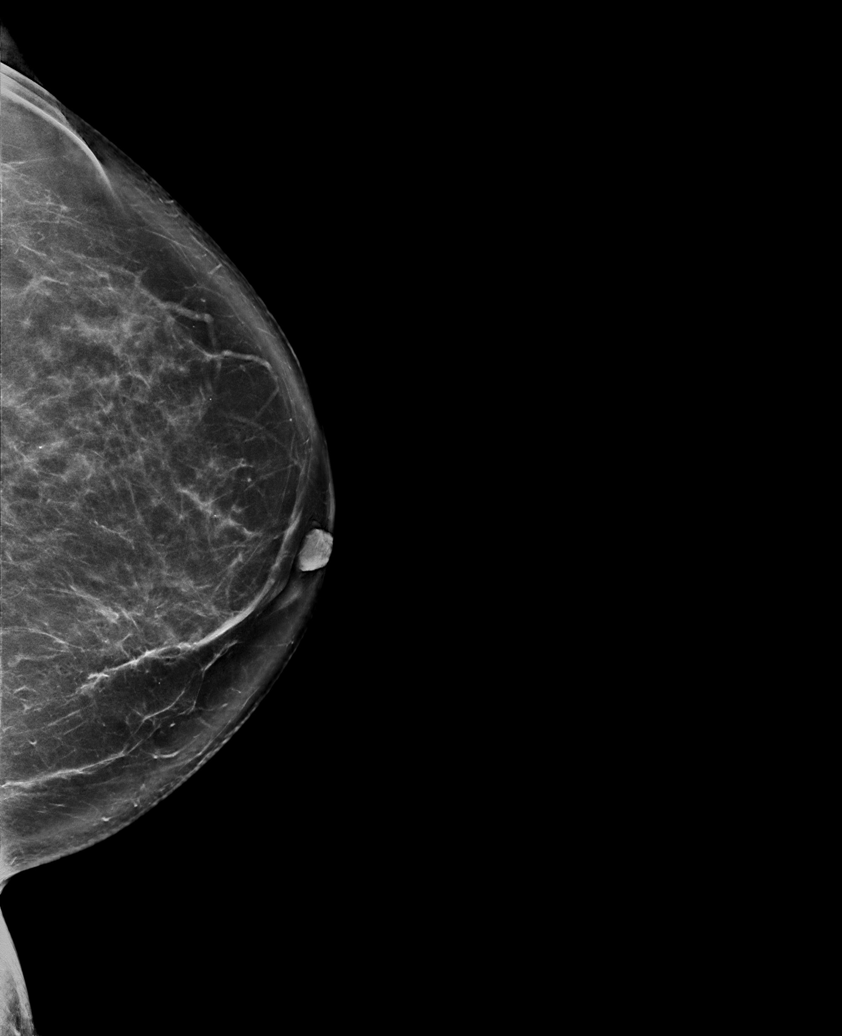

[L MLO synth-2D]
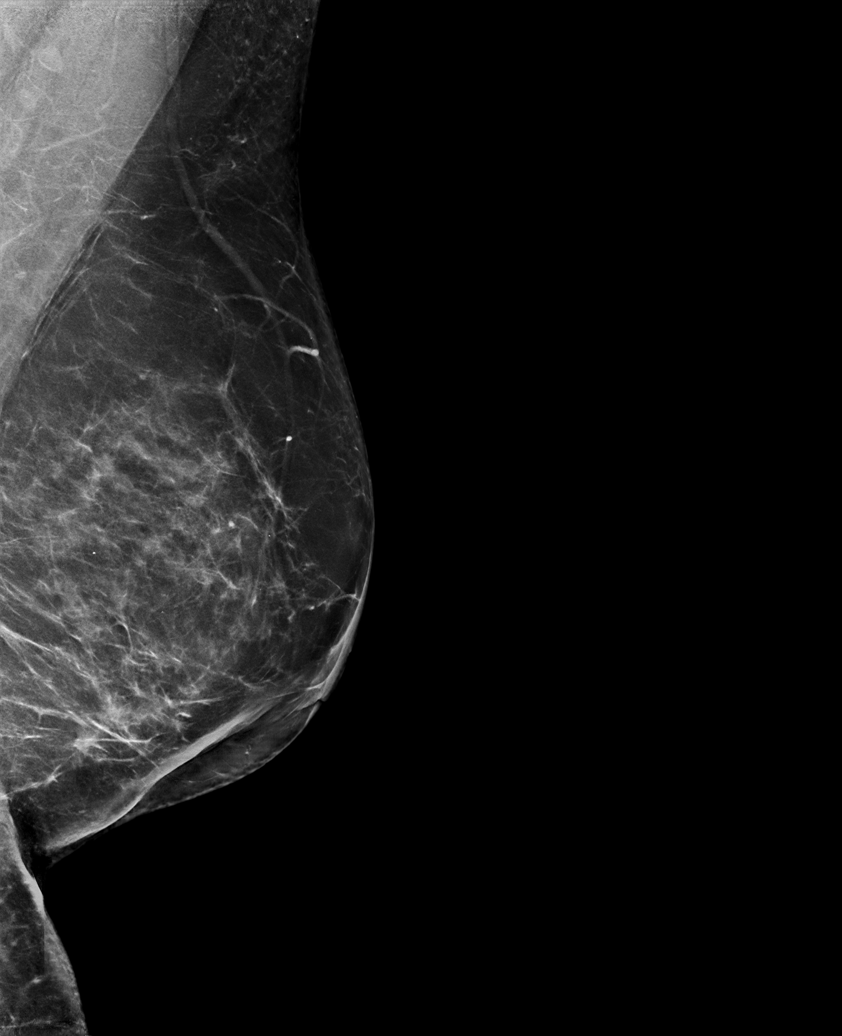

[L CC synth-2D]
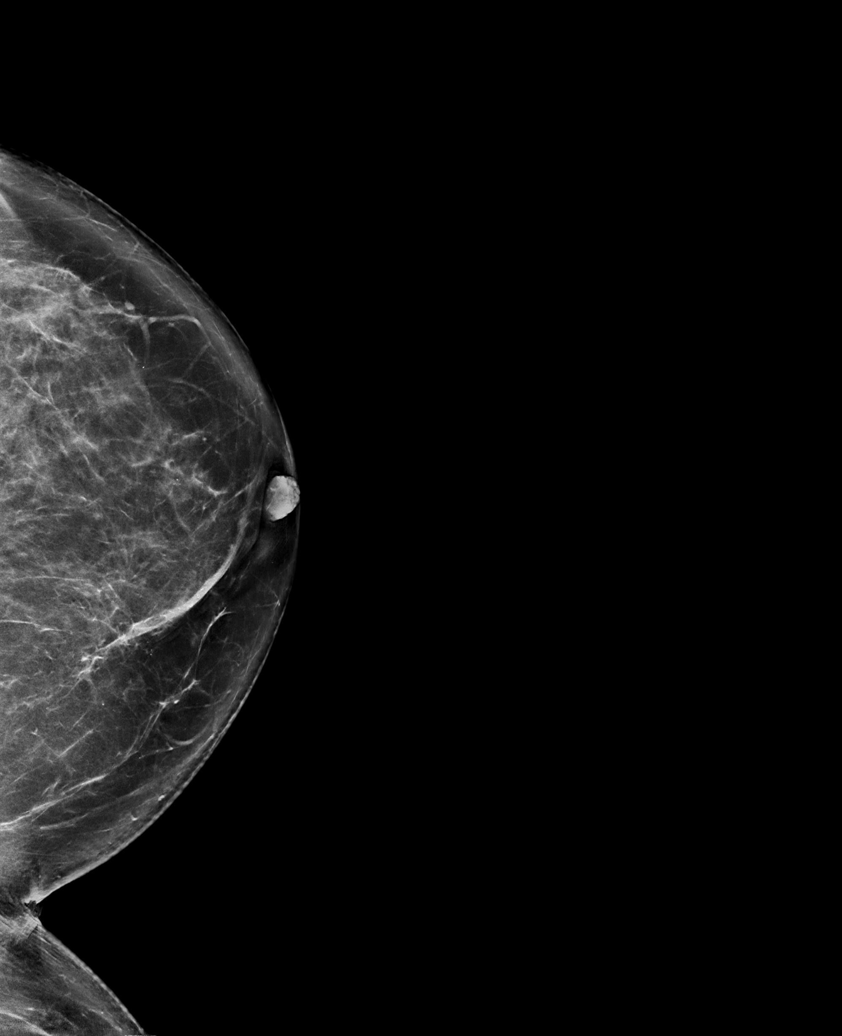

[R CC tomo · tomo slice 42/83.0]
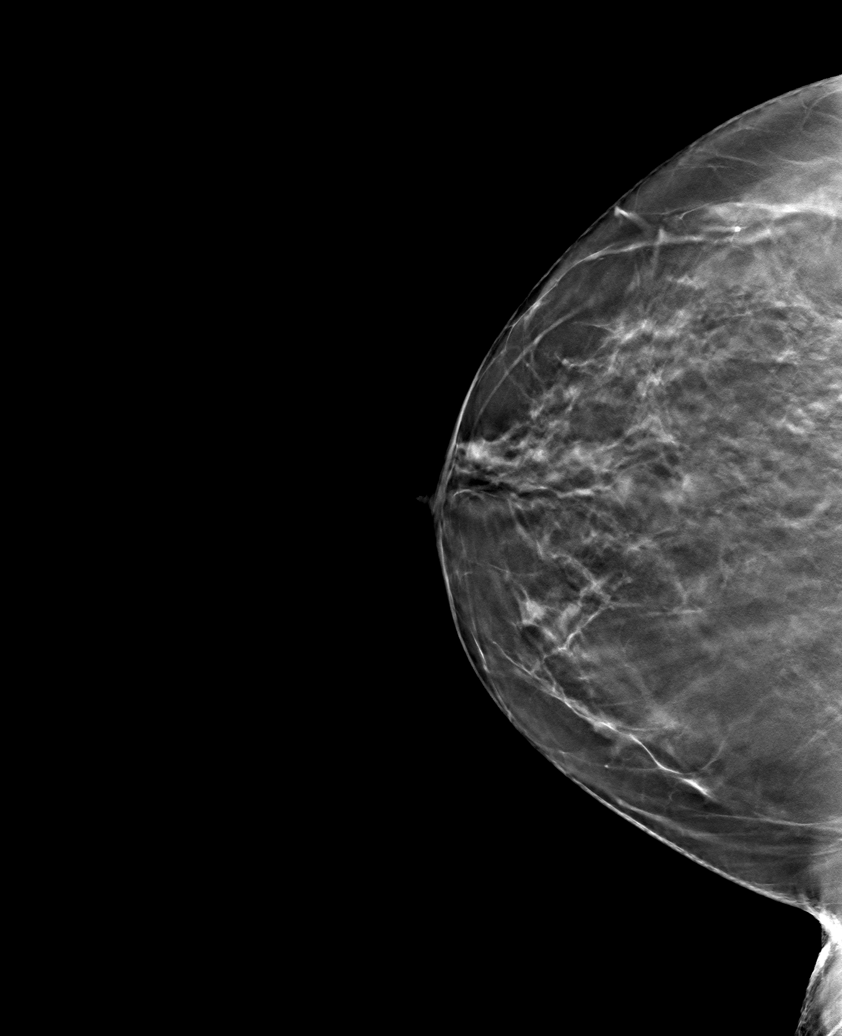

[6 of 30 positions shown; findings below may reference images not displayed]

ACR Breast Density Category b: There are scattered areas of
fibroglandular density.
FINDINGS: In the right breast, possible distortion warrants further
evaluation. In the left breast, no findings suspicious for
malignancy. Images were processed with CAD.
IMPRESSION: Further evaluation is suggested for possible distortion in the right
breast.

RECOMMENDATION:
Diagnostic mammogram and possibly ultrasound of the right breast.
(Code:B2-Z-66E)

The patient will be contacted regarding the findings, and additional
imaging will be scheduled.

BI-RADS CATEGORY  0: Incomplete. Need additional imaging evaluation
and/or prior mammograms for comparison.

## 2021-09-23 DIAGNOSIS — M0589 Other rheumatoid arthritis with rheumatoid factor of multiple sites: Secondary | ICD-10-CM | POA: Diagnosis not present

## 2021-09-23 DIAGNOSIS — M79642 Pain in left hand: Secondary | ICD-10-CM | POA: Diagnosis not present

## 2021-09-23 DIAGNOSIS — M549 Dorsalgia, unspecified: Secondary | ICD-10-CM | POA: Diagnosis not present

## 2021-09-23 DIAGNOSIS — Z79899 Other long term (current) drug therapy: Secondary | ICD-10-CM | POA: Diagnosis not present

## 2021-10-15 DIAGNOSIS — M0589 Other rheumatoid arthritis with rheumatoid factor of multiple sites: Secondary | ICD-10-CM | POA: Diagnosis not present

## 2021-11-10 DIAGNOSIS — S30861A Insect bite (nonvenomous) of abdominal wall, initial encounter: Secondary | ICD-10-CM | POA: Diagnosis not present

## 2021-11-10 DIAGNOSIS — W57XXXA Bitten or stung by nonvenomous insect and other nonvenomous arthropods, initial encounter: Secondary | ICD-10-CM | POA: Diagnosis not present

## 2021-11-12 DIAGNOSIS — M0589 Other rheumatoid arthritis with rheumatoid factor of multiple sites: Secondary | ICD-10-CM | POA: Diagnosis not present

## 2021-11-19 DIAGNOSIS — L438 Other lichen planus: Secondary | ICD-10-CM | POA: Diagnosis not present

## 2021-11-19 DIAGNOSIS — L821 Other seborrheic keratosis: Secondary | ICD-10-CM | POA: Diagnosis not present

## 2021-11-19 DIAGNOSIS — L814 Other melanin hyperpigmentation: Secondary | ICD-10-CM | POA: Diagnosis not present

## 2021-11-19 DIAGNOSIS — L720 Epidermal cyst: Secondary | ICD-10-CM | POA: Diagnosis not present

## 2021-11-19 DIAGNOSIS — L570 Actinic keratosis: Secondary | ICD-10-CM | POA: Diagnosis not present

## 2021-11-23 DIAGNOSIS — M5136 Other intervertebral disc degeneration, lumbar region: Secondary | ICD-10-CM | POA: Diagnosis not present

## 2021-11-23 DIAGNOSIS — M0589 Other rheumatoid arthritis with rheumatoid factor of multiple sites: Secondary | ICD-10-CM | POA: Diagnosis not present

## 2021-11-23 DIAGNOSIS — Z79899 Other long term (current) drug therapy: Secondary | ICD-10-CM | POA: Diagnosis not present

## 2021-11-23 DIAGNOSIS — M797 Fibromyalgia: Secondary | ICD-10-CM | POA: Diagnosis not present

## 2021-12-17 DIAGNOSIS — M0589 Other rheumatoid arthritis with rheumatoid factor of multiple sites: Secondary | ICD-10-CM | POA: Diagnosis not present

## 2021-12-17 DIAGNOSIS — Z79899 Other long term (current) drug therapy: Secondary | ICD-10-CM | POA: Diagnosis not present

## 2022-01-14 DIAGNOSIS — M0589 Other rheumatoid arthritis with rheumatoid factor of multiple sites: Secondary | ICD-10-CM | POA: Diagnosis not present

## 2022-02-18 DIAGNOSIS — M0589 Other rheumatoid arthritis with rheumatoid factor of multiple sites: Secondary | ICD-10-CM | POA: Diagnosis not present

## 2022-02-23 DIAGNOSIS — M5136 Other intervertebral disc degeneration, lumbar region: Secondary | ICD-10-CM | POA: Diagnosis not present

## 2022-02-23 DIAGNOSIS — Z79899 Other long term (current) drug therapy: Secondary | ICD-10-CM | POA: Diagnosis not present

## 2022-02-23 DIAGNOSIS — M797 Fibromyalgia: Secondary | ICD-10-CM | POA: Diagnosis not present

## 2022-02-23 DIAGNOSIS — M0589 Other rheumatoid arthritis with rheumatoid factor of multiple sites: Secondary | ICD-10-CM | POA: Diagnosis not present

## 2022-02-24 ENCOUNTER — Other Ambulatory Visit: Payer: Self-pay | Admitting: General Surgery

## 2022-02-24 DIAGNOSIS — Z171 Estrogen receptor negative status [ER-]: Secondary | ICD-10-CM

## 2022-03-18 DIAGNOSIS — M0589 Other rheumatoid arthritis with rheumatoid factor of multiple sites: Secondary | ICD-10-CM | POA: Diagnosis not present

## 2022-03-28 NOTE — Progress Notes (Signed)
PCP: Merrilee Seashore, MD   No chief complaint on file.   HPI:      [name] is a 56 y.o. G1P1 who LMP was Patient's last menstrual period was 06/28/2013., presents today for her annual examination.  Her menses are absent due to menopause. She does not have PMB. She does have vasomotor sx that are significant but tolerable; improved with retirement/healthier eating. Declines effexor, can't have HRT due to hx of breast cancer.  Sex activity: single partner, contraception - post menopausal status. She does not have vaginal dryness.  Last Pap: 03/18/21 Results were: no abnormalities /neg HPV DNA 2019. On methotrexate for RA so needs yearly paps.  Hx of STDs: none  Last mammogram: 04/09/21 Results were: normal--routine follow-up in 12 months. Followed by Dr. Bary Castilla; has appt 11/23 There is no FH of breast cancer. There is no FH of ovarian cancer. Pt is s/p LT breast cancer 2015 with lumpectomy and radiation. Had neg Myriad genetic testing with Dr. Bary Castilla in 2015. The patient does self-breast exams.  Colonoscopy: never; Cologuard neg 8/21; repeat due after 3 yrs  Tobacco use: The patient denies current or previous tobacco use. Alcohol use: social drinker  No drug use Exercise: mod active; difficult with RA.   Had DEXA with rheumatology. Suggested bisphosphonate but pt hesitant.  She does get adequate calcium and Vitamin D in her diet.  Labs with PCP.   Past Medical History:  Diagnosis Date   Arthritis    BRCA negative 10/04/2013   negative/Myriad   Breast cancer ([place]) 2015   Left breast - radiation   Cancer [facility]) April 2015   BRACA negative, 2 mm ER neg, PR neg, Her 2 neu amplified cancer with extensive DCIS, T1a,N0,M0.   Fall 09-24-13   H/O fibromyalgia    Personal history of radiation therapy    Screening for colon cancer 11/2019   neg Cologuard, repeat after 3 yrs   Thyroid disease     Past Surgical History:  Procedure Laterality Date   BREAST BIOPSY  Left 2015   positive   BREAST LUMPECTOMY Left 2015   BREAST SURGERY Left 09-17-13   INVASIVE MAMMARY CARCINOMA OF NO SPECIAL TYPE,/DCIS    BREAST SURGERY Left 10/18/13   Wide excision, mastoplasty. Sentinel node biopsy.   CYST REMOVAL LEG Right 2000   SALPINGOOPHORECTOMY     dermoid cyst RT    Family History  Problem Relation Age of Onset   Cancer Mother 31       bladder and skin   Cancer Father        Thoracic   Cancer Maternal Grandfather        type of skin cancer   Breast cancer Neg Hx     Social History   Socioeconomic History   Marital status: Single    Spouse name: Not on file   Number of children: Not on file   Years of education: Not on file   Highest education level: Not on file  Occupational History   Not on file  Tobacco Use   Smoking status: Former    Packs/day: 1.00    Years: 20.00    Total pack years: 20.00    Types: Cigarettes   Smokeless tobacco: Never  Vaping Use   Vaping Use: Never used  Substance and Sexual Activity   Alcohol use: Yes   Drug use: No   Sexual activity: Yes    Birth control/protection: Post-menopausal  Other Topics Concern  Not on file  Social History Narrative   Not on file   Social Determinants of Health   Financial Resource Strain: Not on file  Food Insecurity: Not on file  Transportation Needs: Not on file  Physical Activity: Not on file  Stress: Not on file  Social Connections: Not on file  Intimate Partner Violence: Not on file    Outpatient Medications Prior to Visit  Medication Sig Dispense Refill   folic acid (FOLVITE) 1 MG tablet Take 1 mg by mouth daily.     ibuprofen (ADVIL) 600 MG tablet Take 600 mg by mouth 3 (three) times daily.     methotrexate (RHEUMATREX) 2.5 MG tablet Take 2.5 mg by mouth once a week. Caution:Chemotherapy. Protect from light.     predniSONE (DELTASONE) 5 MG tablet prednisone 5 mg tablet   1 tablet every day by oral route.     traMADol (ULTRAM) 50 MG tablet Take 50 mg by mouth  every 12 (twelve) hours as needed.      vitamin B-12 (CYANOCOBALAMIN) 1000 MCG tablet Take 1,000 mcg by mouth daily.     Vitamin D, Ergocalciferol, (DRISDOL) 50000 units CAPS capsule Take 50,000 Units by mouth every 7 (seven) days.     No facility-administered medications prior to visit.       ROS:  Review of Systems  Constitutional:  Positive for fatigue. Negative for fever and unexpected weight change.  Respiratory:  Negative for cough, shortness of breath and wheezing.   Cardiovascular:  Negative for chest pain, palpitations and leg swelling.  Gastrointestinal:  Negative for blood in stool, constipation, diarrhea, nausea and vomiting.  Endocrine: Negative for cold intolerance, heat intolerance and polyuria.  Genitourinary:  Negative for dyspareunia, dysuria, flank pain, frequency, genital sores, hematuria, menstrual problem, pelvic pain, urgency, vaginal bleeding, vaginal discharge and vaginal pain.  Musculoskeletal:  Positive for arthralgias. Negative for back pain, joint swelling and myalgias.  Skin:  Negative for rash.  Neurological:  Negative for dizziness, syncope, light-headedness, numbness and headaches.  Hematological:  Negative for adenopathy.  Psychiatric/Behavioral:  Negative for agitation, confusion, sleep disturbance and suicidal ideas. The patient is not nervous/anxious.    BREAST: No symptoms    Objective: LMP 06/28/2013    Physical Exam Constitutional:      Appearance: She is well-developed.  Genitourinary:     Vulva and rectum normal.     Right Labia: No rash, tenderness or lesions.    Left Labia: No tenderness, lesions or rash.    No vaginal discharge, erythema or tenderness.      Right Adnexa: not tender and no mass present.    Left Adnexa: not tender and no mass present.    No cervical motion tenderness, friability or polyp.     Uterus is not enlarged or tender.  Rectum:     Guaiac result negative.     No anal fissure or tenderness.  Breasts:     Right: No mass, nipple discharge, skin change or tenderness.     Left: No mass, nipple discharge, skin change or tenderness.  Neck:     Thyroid: No thyromegaly.  Cardiovascular:     Rate and Rhythm: Normal rate and regular rhythm.     Heart sounds: Normal heart sounds. No murmur heard. Pulmonary:     Effort: Pulmonary effort is normal.     Breath sounds: Normal breath sounds.  Abdominal:     Palpations: Abdomen is soft.     Tenderness: There is no abdominal tenderness. There  is no guarding or rebound.  Musculoskeletal:        General: Normal range of motion.     Cervical back: Normal range of motion.  Lymphadenopathy:     Cervical: No cervical adenopathy.  Neurological:     General: No focal deficit present.     Mental Status: She is alert and oriented to person, place, and time.     Cranial Nerves: No cranial nerve deficit.  Skin:    General: Skin is warm and dry.  Psychiatric:        Mood and Affect: Mood normal.        Behavior: Behavior normal.        Thought Content: Thought content normal.        Judgment: Judgment normal.  Vitals reviewed.     Assessment/Plan:  Encounter for annual routine gynecological examination  Cervical cancer screening - Plan: Cytology - PAP  Encounter for screening mammogram for malignant neoplasm of breast--has appt with Dr. Bary Castilla          GYN counsel breast self exam, mammography screening, menopause, adequate intake of calcium and vitamin D, diet and exercise    F/U  No follow-ups on file.  Telesforo Brosnahan B. Vashon Arch, PA-C 03/28/2022 9:25 PM

## 2022-03-30 ENCOUNTER — Ambulatory Visit (INDEPENDENT_AMBULATORY_CARE_PROVIDER_SITE_OTHER): Payer: BC Managed Care – PPO | Admitting: Obstetrics and Gynecology

## 2022-03-30 ENCOUNTER — Other Ambulatory Visit (HOSPITAL_COMMUNITY)
Admission: RE | Admit: 2022-03-30 | Discharge: 2022-03-30 | Disposition: A | Discharge status: Home / Self Care | Payer: BC Managed Care – PPO | Source: Ambulatory Visit | Attending: Obstetrics and Gynecology | Admitting: Obstetrics and Gynecology

## 2022-03-30 ENCOUNTER — Encounter: Payer: Self-pay | Admitting: Obstetrics and Gynecology

## 2022-03-30 VITALS — BP 100/70 | Ht 72.0 in | Wt 205.0 lb

## 2022-03-30 DIAGNOSIS — Z1151 Encounter for screening for human papillomavirus (HPV): Secondary | ICD-10-CM

## 2022-03-30 DIAGNOSIS — Z171 Estrogen receptor negative status [ER-]: Secondary | ICD-10-CM

## 2022-03-30 DIAGNOSIS — Z124 Encounter for screening for malignant neoplasm of cervix: Secondary | ICD-10-CM

## 2022-03-30 DIAGNOSIS — Z79620 Long term (current) use of immunosuppressive biologic: Secondary | ICD-10-CM

## 2022-03-30 DIAGNOSIS — Z01411 Encounter for gynecological examination (general) (routine) with abnormal findings: Secondary | ICD-10-CM

## 2022-03-30 DIAGNOSIS — C50312 Malignant neoplasm of lower-inner quadrant of left female breast: Secondary | ICD-10-CM | POA: Diagnosis not present

## 2022-03-30 DIAGNOSIS — Z01419 Encounter for gynecological examination (general) (routine) without abnormal findings: Secondary | ICD-10-CM

## 2022-03-30 DIAGNOSIS — Z1231 Encounter for screening mammogram for malignant neoplasm of breast: Secondary | ICD-10-CM

## 2022-03-30 NOTE — Patient Instructions (Signed)
I value your feedback and you entrusting us with your care. If you get a [place] patient survey, I would appreciate you taking the time to let us know about your experience today. Thank you! ? ? ?

## 2022-04-02 LAB — CYTOLOGY - PAP
Comment: NEGATIVE
Diagnosis: UNDETERMINED — AB
High risk HPV: NEGATIVE

## 2022-04-12 ENCOUNTER — Telehealth: Payer: Self-pay | Admitting: Obstetrics and Gynecology

## 2022-04-12 NOTE — Telephone Encounter (Signed)
I have this patient returning a missed call. Please advise?

## 2022-04-20 DIAGNOSIS — Z853 Personal history of malignant neoplasm of breast: Secondary | ICD-10-CM | POA: Diagnosis not present

## 2022-04-22 ENCOUNTER — Other Ambulatory Visit: Payer: Self-pay | Admitting: General Surgery

## 2022-04-22 DIAGNOSIS — M0589 Other rheumatoid arthritis with rheumatoid factor of multiple sites: Secondary | ICD-10-CM | POA: Diagnosis not present

## 2022-04-22 DIAGNOSIS — Z1231 Encounter for screening mammogram for malignant neoplasm of breast: Secondary | ICD-10-CM

## 2022-05-05 DIAGNOSIS — D0471 Carcinoma in situ of skin of right lower limb, including hip: Secondary | ICD-10-CM | POA: Diagnosis not present

## 2022-05-05 DIAGNOSIS — L570 Actinic keratosis: Secondary | ICD-10-CM | POA: Diagnosis not present

## 2022-05-20 DIAGNOSIS — M0589 Other rheumatoid arthritis with rheumatoid factor of multiple sites: Secondary | ICD-10-CM | POA: Diagnosis not present

## 2022-05-26 DIAGNOSIS — M5136 Other intervertebral disc degeneration, lumbar region: Secondary | ICD-10-CM | POA: Diagnosis not present

## 2022-05-26 DIAGNOSIS — Z79899 Other long term (current) drug therapy: Secondary | ICD-10-CM | POA: Diagnosis not present

## 2022-05-26 DIAGNOSIS — M797 Fibromyalgia: Secondary | ICD-10-CM | POA: Diagnosis not present

## 2022-05-26 DIAGNOSIS — M0589 Other rheumatoid arthritis with rheumatoid factor of multiple sites: Secondary | ICD-10-CM | POA: Diagnosis not present

## 2022-05-27 ENCOUNTER — Ambulatory Visit
Admission: RE | Admit: 2022-05-27 | Discharge: 2022-05-27 | Disposition: A | Discharge status: Home / Self Care | Payer: BC Managed Care – PPO | Source: Ambulatory Visit | Attending: General Surgery | Admitting: General Surgery

## 2022-05-27 DIAGNOSIS — Z1231 Encounter for screening mammogram for malignant neoplasm of breast: Secondary | ICD-10-CM | POA: Insufficient documentation

## 2022-06-17 DIAGNOSIS — M0589 Other rheumatoid arthritis with rheumatoid factor of multiple sites: Secondary | ICD-10-CM | POA: Diagnosis not present

## 2022-07-15 DIAGNOSIS — M0589 Other rheumatoid arthritis with rheumatoid factor of multiple sites: Secondary | ICD-10-CM | POA: Diagnosis not present

## 2022-08-02 DIAGNOSIS — Z85828 Personal history of other malignant neoplasm of skin: Secondary | ICD-10-CM | POA: Diagnosis not present

## 2022-08-02 DIAGNOSIS — L821 Other seborrheic keratosis: Secondary | ICD-10-CM | POA: Diagnosis not present

## 2022-08-10 DIAGNOSIS — H524 Presbyopia: Secondary | ICD-10-CM | POA: Diagnosis not present

## 2022-08-10 DIAGNOSIS — H2513 Age-related nuclear cataract, bilateral: Secondary | ICD-10-CM | POA: Diagnosis not present

## 2022-08-10 DIAGNOSIS — M069 Rheumatoid arthritis, unspecified: Secondary | ICD-10-CM | POA: Diagnosis not present

## 2022-08-10 DIAGNOSIS — H40013 Open angle with borderline findings, low risk, bilateral: Secondary | ICD-10-CM | POA: Diagnosis not present

## 2022-08-10 DIAGNOSIS — H04123 Dry eye syndrome of bilateral lacrimal glands: Secondary | ICD-10-CM | POA: Diagnosis not present

## 2022-08-12 DIAGNOSIS — M0589 Other rheumatoid arthritis with rheumatoid factor of multiple sites: Secondary | ICD-10-CM | POA: Diagnosis not present

## 2022-08-16 DIAGNOSIS — M791 Myalgia, unspecified site: Secondary | ICD-10-CM | POA: Diagnosis not present

## 2022-08-16 DIAGNOSIS — M940 Chondrocostal junction syndrome [Tietze]: Secondary | ICD-10-CM | POA: Diagnosis not present

## 2022-08-24 DIAGNOSIS — M5136 Other intervertebral disc degeneration, lumbar region: Secondary | ICD-10-CM | POA: Diagnosis not present

## 2022-08-24 DIAGNOSIS — Z79899 Other long term (current) drug therapy: Secondary | ICD-10-CM | POA: Diagnosis not present

## 2022-08-24 DIAGNOSIS — M797 Fibromyalgia: Secondary | ICD-10-CM | POA: Diagnosis not present

## 2022-08-24 DIAGNOSIS — M0589 Other rheumatoid arthritis with rheumatoid factor of multiple sites: Secondary | ICD-10-CM | POA: Diagnosis not present

## 2022-09-24 DIAGNOSIS — R051 Acute cough: Secondary | ICD-10-CM | POA: Diagnosis not present

## 2022-09-24 DIAGNOSIS — J208 Acute bronchitis due to other specified organisms: Secondary | ICD-10-CM | POA: Diagnosis not present

## 2022-09-28 DIAGNOSIS — H04123 Dry eye syndrome of bilateral lacrimal glands: Secondary | ICD-10-CM | POA: Diagnosis not present

## 2022-10-07 DIAGNOSIS — M0589 Other rheumatoid arthritis with rheumatoid factor of multiple sites: Secondary | ICD-10-CM | POA: Diagnosis not present

## 2022-10-20 ENCOUNTER — Ambulatory Visit (HOSPITAL_COMMUNITY): Payer: BC Managed Care – PPO

## 2022-10-20 ENCOUNTER — Ambulatory Visit
Admission: RE | Admit: 2022-10-20 | Discharge: 2022-10-20 | Disposition: A | Discharge status: Home / Self Care | Payer: BC Managed Care – PPO | Source: Ambulatory Visit | Attending: Internal Medicine | Admitting: Internal Medicine

## 2022-10-20 ENCOUNTER — Other Ambulatory Visit (HOSPITAL_COMMUNITY): Payer: Self-pay | Admitting: Internal Medicine

## 2022-10-20 DIAGNOSIS — R0602 Shortness of breath: Secondary | ICD-10-CM

## 2022-10-20 DIAGNOSIS — R079 Chest pain, unspecified: Secondary | ICD-10-CM

## 2022-10-20 DIAGNOSIS — R0902 Hypoxemia: Secondary | ICD-10-CM | POA: Diagnosis not present

## 2022-10-20 MED ORDER — IOPAMIDOL (ISOVUE-370) INJECTION 76%
75.0000 mL | Freq: Once | INTRAVENOUS | Status: AC | PRN
  Administered 2022-10-20: 75 mL via INTRAVENOUS

## 2022-11-04 DIAGNOSIS — R002 Palpitations: Secondary | ICD-10-CM | POA: Diagnosis not present

## 2022-11-04 DIAGNOSIS — R079 Chest pain, unspecified: Secondary | ICD-10-CM | POA: Diagnosis not present

## 2022-11-04 DIAGNOSIS — R0602 Shortness of breath: Secondary | ICD-10-CM | POA: Diagnosis not present

## 2022-11-11 DIAGNOSIS — M0589 Other rheumatoid arthritis with rheumatoid factor of multiple sites: Secondary | ICD-10-CM | POA: Diagnosis not present

## 2022-11-23 DIAGNOSIS — R0781 Pleurodynia: Secondary | ICD-10-CM | POA: Diagnosis not present

## 2022-11-23 DIAGNOSIS — S2242XA Multiple fractures of ribs, left side, initial encounter for closed fracture: Secondary | ICD-10-CM | POA: Diagnosis not present

## 2022-11-23 DIAGNOSIS — X58XXXA Exposure to other specified factors, initial encounter: Secondary | ICD-10-CM | POA: Diagnosis not present

## 2022-12-01 ENCOUNTER — Ambulatory Visit: Payer: BC Managed Care – PPO | Admitting: Cardiology

## 2022-12-01 ENCOUNTER — Encounter: Payer: Self-pay | Admitting: Cardiology

## 2022-12-01 VITALS — BP 141/84 | HR 80 | Resp 16 | Ht 71.0 in | Wt 209.0 lb

## 2022-12-01 DIAGNOSIS — R002 Palpitations: Secondary | ICD-10-CM | POA: Diagnosis not present

## 2022-12-01 NOTE — Progress Notes (Signed)
Patient referred by Georgianne Fick, MD for palpitations  Subjective:   Susan Singleton, female    DOB: Mar 01, 1966, 57 y.o.   MRN: [id number]   Chief Complaint  Patient presents with   Palpitations   New Patient (Initial Visit)     HPI  57 y.o. Caucasian female with rheumatoid arthritis, h/o breast cancer s/p left breast lumpectomy and radiation, former smoker, referred for palpitations  Rheumatoid arthritis, patient has been active all her life with walking 2 miles daily until her trip to the [place].  Since her trip, she came back with shortness of breath symptoms, and developed some sort of scarring in her lung for which she is undergoing workup.  In the meantime, she was referred for palpitations.  Patient has palpitations lasting for several hours or day at a time, not associated any new chest pain or shortness of breath.  She also denies any presyncope or syncope.  Episodes are infrequent and happen once every couple months or so.   Past Medical History:  Diagnosis Date   Arthritis    BRCA negative 10/04/2013   negative/Myriad   Breast cancer (HCC) 2015   Left breast - radiation   Cancer [facility]) April 2015   BRACA negative, 2 mm ER neg, PR neg, Her 2 neu amplified cancer with extensive DCIS, T1a,N0,M0.   Fall 09-24-13   H/O fibromyalgia    Personal history of radiation therapy    Screening for colon cancer 11/2019   neg Cologuard, repeat after 3 yrs   Thyroid disease      Past Surgical History:  Procedure Laterality Date   BREAST BIOPSY Left 2015   positive   BREAST LUMPECTOMY Left 2015   BREAST SURGERY Left 09-17-13   INVASIVE MAMMARY CARCINOMA OF NO SPECIAL TYPE,/DCIS    BREAST SURGERY Left 10/18/13   Wide excision, mastoplasty. Sentinel node biopsy.   CYST REMOVAL LEG Right 2000   SALPINGOOPHORECTOMY     dermoid cyst RT     Social History   Tobacco Use  Smoking Status Former   Packs/day: 1.00   Years: 20.00   Additional pack years: 0.00    Total pack years: 20.00   Types: Cigarettes  Smokeless Tobacco Never    Social History   Substance and Sexual Activity  Alcohol Use Yes     Family History  Problem Relation Age of Onset   Cancer Mother 50       bladder and skin   Cancer Father        Thoracic   Cancer Maternal Grandfather        type of skin cancer   Breast cancer Neg Hx       Current Outpatient Medications:    albuterol (VENTOLIN HFA) 108 (90 Base) MCG/ACT inhaler, Inhale 1 puff into the lungs every 6 (six) hours as needed., Disp: , Rfl:    ANORO ELLIPTA 62.5-25 MCG/ACT AEPB, Inhale 1 puff into the lungs daily., Disp: , Rfl:    cyclobenzaprine (FLEXERIL) 10 MG tablet, Take 10 mg by mouth at bedtime., Disp: , Rfl:    abatacept (ORENCIA) 250 MG injection, Inject by intravenous route., Disp: , Rfl:    folic acid (FOLVITE) 1 MG tablet, Take 1 mg by mouth daily., Disp: , Rfl:    ibuprofen (ADVIL) 600 MG tablet, Take 600 mg by mouth 3 (three) times daily., Disp: , Rfl:    methotrexate (RHEUMATREX) 2.5 MG tablet, Take 2.5 mg by mouth once a week. Caution:Chemotherapy. Protect  from light., Disp: , Rfl:    predniSONE (DELTASONE) 5 MG tablet, prednisone 5 mg tablet   1 tablet every day by oral route., Disp: , Rfl:    traMADol (ULTRAM) 50 MG tablet, Take 50 mg by mouth every 12 (twelve) hours as needed. , Disp: , Rfl:    Vitamin D, Ergocalciferol, (DRISDOL) 50000 units CAPS capsule, Take 50,000 Units by mouth every 7 (seven) days., Disp: , Rfl:    Cardiovascular and other pertinent studies:  Reviewed external labs and tests, independently interpreted  EKG 12/01/2022: Sinus rhythm 73 bpm Incomplete RBBB  CTA chest 10/20/2022: There is no evidence of pulmonary artery embolism. There is no evidence of thoracic aortic dissection. There is no focal pulmonary consolidation. There is no pleural effusion or pneumothorax.   Small linear densities in the posterior lower lung fields may suggest scarring or subsegmental  atelectasis. There is moderate sized fixed hiatal hernia.   Mild decrease in height of 2 of the lower thoracic vertebral bodies may be residual from previous injury.    Recent labs: Not available    Review of Systems  Cardiovascular:  Positive for dyspnea on exertion and palpitations. Negative for chest pain, leg swelling and syncope.         Vitals:   12/01/22 1323  BP: (!) 141/84  Pulse: 80  Resp: 16  SpO2: 97%     Body mass index is 29.15 kg/m. Filed Weights   12/01/22 1323  Weight: 209 lb (94.8 kg)     Objective:   Physical Exam Vitals and nursing note reviewed.  Constitutional:      General: She is not in acute distress. Neck:     Vascular: No JVD.  Cardiovascular:     Rate and Rhythm: Normal rate and regular rhythm.     Heart sounds: Normal heart sounds. No murmur heard. Pulmonary:     Effort: Pulmonary effort is normal.     Breath sounds: Normal breath sounds. No wheezing or rales.  Musculoskeletal:     Right lower leg: No edema.     Left lower leg: No edema.         Visit diagnoses:   ICD-10-CM   1. Palpitations  R00.2 EKG 12-Lead       Orders Placed This Encounter  Procedures   EKG 12-Lead     Assessment & Recommendations:    57 y.o. Caucasian female with rheumatoid arthritis, h/o breast cancer s/p left breast lumpectomy and radiation, former smoker, referred for palpitations  Palpitations: Infrequent.  I am not sure we will catch them on a 2-week cardiac telemetry.  Physical exam is normal.  Incomplete RBBB is old and known to the patient.  I encouraged her to use a smart watch such as Apple Watch or something allergy, or Kardia mobile device to monitor her palpitations in the long-term.  If she has increasing symptoms, or any of the smart watch is alert her for any abnormal heart rhythm, I will then place her on cardiac telemetry and perform echocardiogram.  I will see her on as-needed basis.   Thank you for referring the  patient to [place]. Please feel free to contact with any questions.   Elder Negus, MD Pager: [phone] Office: [phone]

## 2022-12-09 DIAGNOSIS — M0589 Other rheumatoid arthritis with rheumatoid factor of multiple sites: Secondary | ICD-10-CM | POA: Diagnosis not present

## 2022-12-15 DIAGNOSIS — M8580 Other specified disorders of bone density and structure, unspecified site: Secondary | ICD-10-CM | POA: Diagnosis not present

## 2022-12-15 DIAGNOSIS — M0589 Other rheumatoid arthritis with rheumatoid factor of multiple sites: Secondary | ICD-10-CM | POA: Diagnosis not present

## 2022-12-15 DIAGNOSIS — M8589 Other specified disorders of bone density and structure, multiple sites: Secondary | ICD-10-CM | POA: Diagnosis not present

## 2022-12-15 DIAGNOSIS — S2239XA Fracture of one rib, unspecified side, initial encounter for closed fracture: Secondary | ICD-10-CM | POA: Diagnosis not present

## 2022-12-15 DIAGNOSIS — Z79899 Other long term (current) drug therapy: Secondary | ICD-10-CM | POA: Diagnosis not present

## 2023-01-01 DIAGNOSIS — H66002 Acute suppurative otitis media without spontaneous rupture of ear drum, left ear: Secondary | ICD-10-CM | POA: Diagnosis not present

## 2023-01-20 DIAGNOSIS — M0589 Other rheumatoid arthritis with rheumatoid factor of multiple sites: Secondary | ICD-10-CM | POA: Diagnosis not present

## 2023-01-31 DIAGNOSIS — L821 Other seborrheic keratosis: Secondary | ICD-10-CM | POA: Diagnosis not present

## 2023-01-31 DIAGNOSIS — I788 Other diseases of capillaries: Secondary | ICD-10-CM | POA: Diagnosis not present

## 2023-01-31 DIAGNOSIS — L438 Other lichen planus: Secondary | ICD-10-CM | POA: Diagnosis not present

## 2023-01-31 DIAGNOSIS — L570 Actinic keratosis: Secondary | ICD-10-CM | POA: Diagnosis not present

## 2023-01-31 DIAGNOSIS — L814 Other melanin hyperpigmentation: Secondary | ICD-10-CM | POA: Diagnosis not present

## 2023-02-11 DIAGNOSIS — R000 Tachycardia, unspecified: Secondary | ICD-10-CM | POA: Diagnosis not present

## 2023-02-11 DIAGNOSIS — I499 Cardiac arrhythmia, unspecified: Secondary | ICD-10-CM | POA: Diagnosis not present

## 2023-02-17 DIAGNOSIS — M0589 Other rheumatoid arthritis with rheumatoid factor of multiple sites: Secondary | ICD-10-CM | POA: Diagnosis not present

## 2023-02-21 DIAGNOSIS — R002 Palpitations: Secondary | ICD-10-CM | POA: Diagnosis not present

## 2023-02-23 DIAGNOSIS — R002 Palpitations: Secondary | ICD-10-CM | POA: Diagnosis not present

## 2023-03-07 DIAGNOSIS — H66002 Acute suppurative otitis media without spontaneous rupture of ear drum, left ear: Secondary | ICD-10-CM | POA: Diagnosis not present

## 2023-03-21 DIAGNOSIS — M8580 Other specified disorders of bone density and structure, unspecified site: Secondary | ICD-10-CM | POA: Diagnosis not present

## 2023-03-21 DIAGNOSIS — Z79899 Other long term (current) drug therapy: Secondary | ICD-10-CM | POA: Diagnosis not present

## 2023-03-21 DIAGNOSIS — M0589 Other rheumatoid arthritis with rheumatoid factor of multiple sites: Secondary | ICD-10-CM | POA: Diagnosis not present

## 2023-03-21 DIAGNOSIS — S2239XA Fracture of one rib, unspecified side, initial encounter for closed fracture: Secondary | ICD-10-CM | POA: Diagnosis not present

## 2023-03-22 ENCOUNTER — Ambulatory Visit: Payer: BC Managed Care – PPO | Admitting: Nurse Practitioner

## 2023-03-22 ENCOUNTER — Telehealth: Payer: Self-pay | Admitting: *Deleted

## 2023-03-22 DIAGNOSIS — M0589 Other rheumatoid arthritis with rheumatoid factor of multiple sites: Secondary | ICD-10-CM | POA: Diagnosis not present

## 2023-03-22 NOTE — Telephone Encounter (Signed)
Patwardhan, Anabel Bene, MD  Elliot Cousin, RMA; Loa Socks, LPN 1 episode of rapid heartbeat, possibly symptomatic, no significant dangerous arrhythmias noted.  If severe symptoms, can be started on metoprolol tartrate 12.5 mg twice daily with follow-up with APP in a few weeks.  If not symptomatic, no medication needed at this time.  Thanks MJP   Outside monitor ordered by pts PCP and sent to our office as records, for referral placed back to Cards for zio findings.   Pts PCP referred the pt back for cardiology consult, for noted finding of SVT on zio monitor.  Pt will be seeing Lorin Picket NP at [facility] office for this on 11/5.  Per Dr. Rosemary Holms, being the pt will be seeing Joni Reining NP on 11/5, no changes to be made, pt to see Samara Deist as planned and further changes can be made at that time if needed.    OFF NOTE:  Zio monitor report from PCP scanned into media in pts chart.

## 2023-03-27 NOTE — Progress Notes (Unsigned)
Cardiology Office Note:  .   Date:  03/29/2023  ID:  Susan Singleton, DOB [birth year], MRN [id number] PCP: Georgianne Fick, MD  [facility] Providers Cardiologist:  Dr.Patwardhan  }   History of Present Illness: .   Susan Singleton is a 57 y.o. female we are following for ongoing assessment and management of palpitations.  She has a prior history of rheumatoid arthritis, breast cancer status post left breast lumpectomy with radiation.  Last seen in the office by Dr.Patwardhan on 12/01/2022.  She was to monitor this through her Apple Watch or cardia mobile and report recurrence of palpitations.  If she has recurrent or worsening symptoms a cardiac ZIO monitor will be placed.   The patient reports that late September she was awakened at night with racing heart rate lasting for several hours.  She got up walked around beared down cough and this did not change.  It occurred between 2 AM around 7 AM.  She made an appointment with her primary care and by the time she was seen the rapid heart rate had subsided on its own.  The patient continues to have intermittent episodes of palpitations and heart racing.  She did have a recent ZIO monitor report completed dated 9/20 5 through 9 27.  This did reveal salvos of SVT, lasting up to 6 bpm with a maximum heart rate of 136 bpm.  These are documented at 1:30 in the afternoon, and at 2:48 AM.  She denies chest pain associated with these episodes, no dizziness, but does have chronic fatigue from rheumatoid arthritis.  Labs are followed by PCP and she does not remember whether a TSH had been drawn on labs recently.  ROS: As above otherwise negative.  Studies Reviewed: Marland Kitchen     EKG Interpretation Date/Time:  Tuesday March 29 2023 10:51:59 EST Ventricular Rate:  64 PR Interval:  150 QRS Duration:  88 QT Interval:  410 QTC Calculation: 422 R Axis:   26  Text Interpretation: Normal sinus rhythm Right bundle branch block Normal ECG No previous ECGs  available Confirmed by Joni Reining [phone]) on 03/29/2023 11:54:10 AM    Physical Exam:   VS:  BP (!) 130/90   Pulse 64   Ht 5\' 11"  (1.803 m)   Wt 208 lb (94.3 kg)   LMP 06/28/2013   SpO2 99%   BMI 29.01 kg/m    Wt Readings from Last 3 Encounters:  03/29/23 208 lb (94.3 kg)  12/01/22 209 lb (94.8 kg)  03/30/22 205 lb (93 kg)    GEN: Well nourished, well developed in no acute distress NECK: No JVD; No carotid bruits CARDIAC: RRR, no murmurs, rubs, gallops RESPIRATORY:  Clear to auscultation without rales, wheezing or rhonchi  ABDOMEN: Soft, non-tender, non-distended EXTREMITIES:  No edema; No deformity   ASSESSMENT AND PLAN: .    Paroxysmal supraventricular tachycardia: This was noted on recent ZIO monitor September 2024.  Burden was less than 1% of the time.  However, she did have 1 episode lasting several hours during the night that was not recorded as she was not wearing the monitor.  She has a cardia mobile which suggested atrial fibrillation versus tachycardia.  I have reviewed labs and most recent TSH was in 10/11/2022 with result of 1.34.  She is reluctant to be placed on long-term beta-blocker therapy.  I am in agreement with this as she already has fatigue from rheumatoid arthritis.  I am going to give her a prescription for propranolol  20 mg as needed rapid heart rhythm lasting greater then 30 minutes.  I am going to have an echocardiogram completed for further evaluation of structural heart disease.   Secondly I am going to do a home sleep study.  She states she was awakened and I by rapid heart rhythm, she has had a friend today that she has stopped breathing sometimes at nighttime.  She is not aware if she is snoring.  She does have some daytime somnolence which she is related to rheumatoid arthritis.  Would like to evaluate this little further to see if this is contributing to her symptoms.  2.  Rheumatoid arthritis: Followed by rheumatologist.  She is on prednisone  daily.  This may be contributing to rapid heart rhythm due to steroids, she remains on methotrexate.  Follow-up labs, eye exams etc. per rheumatology.          Signed, Bettey Mare. Liborio Nixon, ANP, AACC

## 2023-03-29 ENCOUNTER — Encounter: Payer: Self-pay | Admitting: Adult Health

## 2023-03-29 ENCOUNTER — Ambulatory Visit: Payer: BC Managed Care – PPO | Attending: Adult Health | Admitting: Adult Health

## 2023-03-29 VITALS — BP 130/90 | HR 64 | Ht 71.0 in | Wt 208.0 lb

## 2023-03-29 DIAGNOSIS — F513 Sleepwalking [somnambulism]: Secondary | ICD-10-CM | POA: Diagnosis not present

## 2023-03-29 DIAGNOSIS — R002 Palpitations: Secondary | ICD-10-CM | POA: Diagnosis not present

## 2023-03-29 DIAGNOSIS — I471 Supraventricular tachycardia, unspecified: Secondary | ICD-10-CM | POA: Diagnosis not present

## 2023-03-29 MED ORDER — PROPRANOLOL HCL 20 MG PO TABS: 20.0000 mg | ORAL_TABLET | ORAL | 1 refills | Status: DC | PRN

## 2023-03-29 NOTE — Patient Instructions (Signed)
Medication Instructions:  Propranolol 20 mg  ( Take 1 Tablet As needed For Palpitations). *If you need a refill on your cardiac medications before your next appointment, please call your pharmacy*   Lab Work: No Labs If you have labs (blood work) drawn today and your tests are completely normal, you will receive your results only by: MyChart Message (if you have MyChart) OR A paper copy in the mail If you have any lab test that is abnormal or we need to change your treatment, we will call you to review the results.   Testing/Procedures: [address]. Your physician has requested that you have an echocardiogram. Echocardiography is a painless test that uses sound waves to create images of your heart. It provides your doctor with information about the size and shape of your heart and how well your heart's chambers and valves are working. This procedure takes approximately one hour. There are no restrictions for this procedure. Please do NOT wear cologne, perfume, aftershave, or lotions (deodorant is allowed). Please arrive 15 minutes prior to your appointment time.  Please note: We ask at that you not bring children with you during ultrasound (echo/ vascular) testing. Due to room size and safety concerns, children are not allowed in the ultrasound rooms during exams. Our front office staff cannot provide observation of children in our lobby area while testing is being conducted. An adult accompanying a patient to their appointment will only be allowed in the ultrasound room at the discretion of the ultrasound technician under special circumstances. We apologize for any inconvenience.    [facility] Your physician has recommended that you have a sleep study. This test records several body functions during sleep, including: brain activity, eye movement, oxygen and carbon dioxide blood levels, heart rate and rhythm, breathing rate and rhythm, the flow of air  through your mouth and nose, snoring, body muscle movements, and chest and belly movement.     Follow-Up: At [facility], you and your health needs are our priority.  As part of our continuing mission to provide you with exceptional heart care, we have created designated Provider Care Teams.  These Care Teams include your primary Cardiologist (physician) and Advanced Practice Providers (APPs -  Physician Assistants and Nurse Practitioners) who all work together to provide you with the care you need, when you need it.  We recommend signing up for the patient portal called "MyChart".  Sign up information is provided on this After Visit Summary.  MyChart is used to connect with patients for Virtual Visits (Telemedicine).  Patients are able to view lab/test results, encounter notes, upcoming appointments, etc.  Non-urgent messages can be sent to your provider as well.   To learn more about what you can do with MyChart, go to [email or web address].    Your next appointment:   2 month(s)  Provider:   Joni Reining, DNP, ANP

## 2023-04-05 ENCOUNTER — Ambulatory Visit: Payer: BC Managed Care – PPO | Admitting: Obstetrics and Gynecology

## 2023-04-11 NOTE — Telephone Encounter (Signed)
Forwarded to Sleep pool

## 2023-04-15 ENCOUNTER — Other Ambulatory Visit: Payer: Self-pay | Admitting: General Surgery

## 2023-04-15 DIAGNOSIS — Z1231 Encounter for screening mammogram for malignant neoplasm of breast: Secondary | ICD-10-CM

## 2023-04-18 ENCOUNTER — Telehealth: Payer: Self-pay

## 2023-04-18 ENCOUNTER — Other Ambulatory Visit: Payer: Self-pay

## 2023-04-18 DIAGNOSIS — G4733 Obstructive sleep apnea (adult) (pediatric): Secondary | ICD-10-CM

## 2023-04-18 NOTE — Telephone Encounter (Signed)
**Note De-Identified Susan Singleton Obfuscation** Per the [facility] Provider Portal: Order ID: [id number] Authorized CPT Code: L2440 Approval Valid Through:04/24/2023 - 06/22/2023  I have sent the pt a [facility] message advising her of this approval.

## 2023-04-18 NOTE — Telephone Encounter (Signed)
HST ordered as Normal in Error. Order corrected today 04/18/23.

## 2023-04-26 ENCOUNTER — Ambulatory Visit: Payer: BC Managed Care – PPO | Admitting: Obstetrics and Gynecology

## 2023-05-02 ENCOUNTER — Ambulatory Visit (HOSPITAL_COMMUNITY): Payer: BC Managed Care – PPO

## 2023-05-08 DIAGNOSIS — J029 Acute pharyngitis, unspecified: Secondary | ICD-10-CM | POA: Diagnosis not present

## 2023-05-08 DIAGNOSIS — R051 Acute cough: Secondary | ICD-10-CM | POA: Diagnosis not present

## 2023-05-19 DIAGNOSIS — M0589 Other rheumatoid arthritis with rheumatoid factor of multiple sites: Secondary | ICD-10-CM | POA: Diagnosis not present

## 2023-05-31 ENCOUNTER — Other Ambulatory Visit (HOSPITAL_COMMUNITY)
Admission: RE | Admit: 2023-05-31 | Discharge: 2023-05-31 | Disposition: A | Discharge status: Home / Self Care | Payer: BC Managed Care – PPO | Source: Ambulatory Visit | Attending: Obstetrics and Gynecology | Admitting: Obstetrics and Gynecology

## 2023-05-31 ENCOUNTER — Ambulatory Visit (INDEPENDENT_AMBULATORY_CARE_PROVIDER_SITE_OTHER): Payer: BC Managed Care – PPO | Admitting: Obstetrics and Gynecology

## 2023-05-31 ENCOUNTER — Encounter: Payer: Self-pay | Admitting: Obstetrics and Gynecology

## 2023-05-31 VITALS — BP 134/83 | HR 78 | Ht 71.0 in | Wt 214.0 lb

## 2023-05-31 DIAGNOSIS — Z1151 Encounter for screening for human papillomavirus (HPV): Secondary | ICD-10-CM | POA: Insufficient documentation

## 2023-05-31 DIAGNOSIS — N951 Menopausal and female climacteric states: Secondary | ICD-10-CM

## 2023-05-31 DIAGNOSIS — Z01419 Encounter for gynecological examination (general) (routine) without abnormal findings: Secondary | ICD-10-CM

## 2023-05-31 DIAGNOSIS — Z124 Encounter for screening for malignant neoplasm of cervix: Secondary | ICD-10-CM | POA: Insufficient documentation

## 2023-05-31 DIAGNOSIS — Z79620 Long term (current) use of immunosuppressive biologic: Secondary | ICD-10-CM

## 2023-05-31 DIAGNOSIS — Z171 Estrogen receptor negative status [ER-]: Secondary | ICD-10-CM

## 2023-05-31 DIAGNOSIS — R87610 Atypical squamous cells of undetermined significance on cytologic smear of cervix (ASC-US): Secondary | ICD-10-CM | POA: Insufficient documentation

## 2023-05-31 DIAGNOSIS — Z1231 Encounter for screening mammogram for malignant neoplasm of breast: Secondary | ICD-10-CM

## 2023-05-31 DIAGNOSIS — Z1211 Encounter for screening for malignant neoplasm of colon: Secondary | ICD-10-CM

## 2023-05-31 NOTE — Progress Notes (Signed)
 PCP: Verdia Lombard, MD   Chief Complaint  Patient presents with   Gynecologic Exam    No concerns    HPI:      Susan Singleton is a 58 y.o. G1P1 who LMP was Patient's last menstrual period was 06/28/2013., presents today for her annual examination.  Her menses are absent due to menopause. She does not have PMB. She does have vasomotor sx that are significant but tolerable; improved with increased water intake/healthier eating. Declined effexor in past, can't have HRT due to hx of breast cancer.  Sex activity: single partner, contraception - post menopausal status. She does not have vaginal dryness/pain/bleeding.  Last Pap: 03/30/22 Results were: ASCUS/neg HPV DNA. On methotrexate for RA so needs yearly paps.  Hx of STDs: none  Last mammogram: 05/27/22 Results were: normal--routine follow-up in 12 months. Has appt 1/25 There is no FH of breast cancer. There is no FH of ovarian cancer. Pt is s/p LT breast cancer 2015 with lumpectomy and radiation; no further tx needed. Being followed by gen surg yearly. Had neg Myriad genetic testing with Dr. Dessa in 2015. The patient does some self-breast exams.  Colonoscopy: never; Cologuard neg 8/21; repeat due after 3 yrs, not done. Wants repeat cologuard.   Tobacco use: The patient denies current or previous tobacco use. Alcohol use: social drinker  No drug use Exercise: mod active; difficult with RA.   Had DEXA with rheumatology. Suggested bisphosphonate but pt hesitant.  She does get adequate calcium and Vitamin D in her diet.  Labs with PCP. Seeing cardio for palpitations, PSVT, getting sleep study.    Past Medical History:  Diagnosis Date   Arthritis    BRCA negative 10/04/2013   negative/Myriad   Breast cancer (HCC) 2015   Left breast - radiation   Cancer (HCC) 08/2013   BRACA negative, 2 mm ER neg, PR neg, Her 2 neu amplified cancer with extensive DCIS, T1a,N0,M0.   Fall 09/24/2013   H/O fibromyalgia    Personal  history of radiation therapy    Screening for colon cancer 11/2019   neg Cologuard, repeat after 3 yrs   Skin cancer    Right leg   Thyroid  disease     Past Surgical History:  Procedure Laterality Date   BREAST BIOPSY Left 2015   positive   BREAST LUMPECTOMY Left 2015   BREAST SURGERY Left 09-17-13   INVASIVE MAMMARY CARCINOMA OF NO SPECIAL TYPE,/DCIS    BREAST SURGERY Left 10/18/13   Wide excision, mastoplasty. Sentinel node biopsy.   CYST REMOVAL LEG Right 2000   SALPINGOOPHORECTOMY     dermoid cyst RT    Family History  Problem Relation Age of Onset   Cancer Mother 20       bladder and skin   Cancer Father        Thoracic   Cancer Maternal Grandfather        type of skin cancer   Breast cancer Neg Hx     Social History   Socioeconomic History   Marital status: Single    Spouse name: Not on file   Number of children: Not on file   Years of education: Not on file   Highest education level: Not on file  Occupational History   Not on file  Tobacco Use   Smoking status: Former    Current packs/day: 1.00    Average packs/day: 1 pack/day for 20.0 years (20.0 ttl pk-yrs)    Types: Cigarettes  Smokeless tobacco: Never  Vaping Use   Vaping status: Never Used  Substance and Sexual Activity   Alcohol use: Yes   Drug use: No   Sexual activity: Yes    Birth control/protection: Post-menopausal  Other Topics Concern   Not on file  Social History Narrative   Not on file   Social Drivers of Health   Financial Resource Strain: Not on file  Food Insecurity: Not on file  Transportation Needs: Not on file  Physical Activity: Not on file  Stress: Not on file  Social Connections: Not on file  Intimate Partner Violence: Not on file    Outpatient Medications Prior to Visit  Medication Sig Dispense Refill   abatacept  (ORENCIA ) 250 MG injection Inject by intravenous route.     Cyanocobalamin ([employer] VITAMIN B-12 PO) Take by mouth.     folic acid (FOLVITE) 1 MG tablet  Take 1 mg by mouth daily.     ibuprofen (ADVIL) 600 MG tablet Take 600 mg by mouth 3 (three) times daily.     methotrexate (RHEUMATREX) 2.5 MG tablet Take 2.5 mg by mouth once a week. Caution:Chemotherapy. Protect from light.     predniSONE (DELTASONE) 5 MG tablet prednisone 5 mg tablet   1 tablet every day by oral route.     traMADol (ULTRAM) 50 MG tablet Take 50 mg by mouth every 12 (twelve) hours as needed.      Vitamin D, Ergocalciferol, (DRISDOL) 50000 units CAPS capsule Take 50,000 Units by mouth every 7 (seven) days.     propranolol  (INDERAL ) 20 MG tablet Take 1 tablet (20 mg total) by mouth as needed. 30 tablet 1   No facility-administered medications prior to visit.       ROS:  Review of Systems  Constitutional:  Negative for fatigue, fever and unexpected weight change.  Respiratory:  Negative for cough, shortness of breath and wheezing.   Cardiovascular:  Negative for chest pain, palpitations and leg swelling.  Gastrointestinal:  Negative for blood in stool, constipation, diarrhea, nausea and vomiting.  Endocrine: Negative for cold intolerance, heat intolerance and polyuria.  Genitourinary:  Negative for dyspareunia, dysuria, flank pain, frequency, genital sores, hematuria, menstrual problem, pelvic pain, urgency, vaginal bleeding, vaginal discharge and vaginal pain.  Musculoskeletal:  Negative for back pain, joint swelling and myalgias.  Skin:  Negative for rash.  Neurological:  Negative for dizziness, syncope, light-headedness, numbness and headaches.  Hematological:  Negative for adenopathy.  Psychiatric/Behavioral:  Negative for agitation, confusion, sleep disturbance and suicidal ideas. The patient is not nervous/anxious.    BREAST: No symptoms    Objective: BP 134/83   Pulse 78   Ht 5' 11 (1.803 m)   Wt 214 lb (97.1 kg)   LMP 06/28/2013   BMI 29.85 kg/m    Physical Exam Constitutional:      Appearance: She is well-developed.  Genitourinary:     Vulva  and rectum normal.     Right Labia: No rash, tenderness or lesions.    Left Labia: No tenderness, lesions or rash.    No vaginal discharge, erythema or tenderness.      Right Adnexa: not tender and no mass present.    Left Adnexa: not tender and no mass present.    No cervical motion tenderness, friability or polyp.     Uterus is not enlarged or tender.  Rectum:     Guaiac result negative.     No anal fissure or tenderness.  Breasts:    Right: No  mass, nipple discharge, skin change or tenderness.     Left: No mass, nipple discharge, skin change or tenderness.  Neck:     Thyroid : No thyromegaly.  Cardiovascular:     Rate and Rhythm: Normal rate and regular rhythm.     Heart sounds: Normal heart sounds. No murmur heard. Pulmonary:     Effort: Pulmonary effort is normal.     Breath sounds: Normal breath sounds.  Abdominal:     Palpations: Abdomen is soft.     Tenderness: There is no abdominal tenderness. There is no guarding or rebound.  Musculoskeletal:        General: Normal range of motion.     Cervical back: Normal range of motion.  Lymphadenopathy:     Cervical: No cervical adenopathy.  Neurological:     General: No focal deficit present.     Mental Status: She is alert and oriented to person, place, and time.     Cranial Nerves: No cranial nerve deficit.  Skin:    General: Skin is warm and dry.  Psychiatric:        Mood and Affect: Mood normal.        Behavior: Behavior normal.        Thought Content: Thought content normal.        Judgment: Judgment normal.  Vitals reviewed.     Assessment/Plan: Encounter for annual routine gynecological examination  Cervical cancer screening - Plan: Cytology - PAP  Screening for HPV (human papillomavirus) - Plan: Cytology - PAP  ASCUS of cervix with negative high risk HPV - Plan: Cytology - PAP; repeat due today, will f/u if abn.   Long term (current) use of immunosuppressive biologic - Plan: Cytology - PAP  Encounter for  screening mammogram for malignant neoplasm of breast; pt has mammo appt scheduled  Malignant neoplasm of lower-inner quadrant of left breast in female, estrogen receptor negative (HCC); no longer doing tx, followed by gen surg  Screening for colon cancer - Plan: Cologuard; colonoscopy/cologuard discussed. Pt elects cologuard. Ref sent. Will f/u with results.  Vasomotor symptoms due to menopause--discussed veozah. Pt to consider and f/u if desires. Need to confirm ok with RA meds.    Malignant neoplasm of lower-inner quadrant of left breast in female, estrogen receptor negative (HCC); followed by DR. Byrnett          GYN counsel breast self exam, mammography screening, menopause, adequate intake of calcium and vitamin D, diet and exercise    F/U  Return in about 1 year (around 05/30/2024).  Xiomar Crompton B. Annastasia Haskins, PA-C 05/31/2023 2:20 PM

## 2023-05-31 NOTE — Patient Instructions (Signed)
 I value your feedback and you entrusting [place] with your care. If you get a [place] patient survey, I would appreciate you taking the time to let us know about your experience today. Thank you! ? ? ?

## 2023-06-03 LAB — CYTOLOGY - PAP
Comment: NEGATIVE
Diagnosis: NEGATIVE
High risk HPV: NEGATIVE

## 2023-06-15 ENCOUNTER — Ambulatory Visit
Admission: RE | Admit: 2023-06-15 | Discharge: 2023-06-15 | Disposition: A | Discharge status: Home / Self Care | Payer: BC Managed Care – PPO | Source: Ambulatory Visit | Attending: General Surgery | Admitting: General Surgery

## 2023-06-15 DIAGNOSIS — Z1231 Encounter for screening mammogram for malignant neoplasm of breast: Secondary | ICD-10-CM | POA: Insufficient documentation

## 2023-06-16 ENCOUNTER — Ambulatory Visit: Payer: BC Managed Care – PPO | Admitting: Cardiology

## 2023-06-16 DIAGNOSIS — Z1211 Encounter for screening for malignant neoplasm of colon: Secondary | ICD-10-CM | POA: Diagnosis not present

## 2023-06-17 ENCOUNTER — Ambulatory Visit (HOSPITAL_BASED_OUTPATIENT_CLINIC_OR_DEPARTMENT_OTHER): Payer: BC Managed Care – PPO | Attending: Adult Health | Admitting: Cardiology

## 2023-06-17 DIAGNOSIS — G4733 Obstructive sleep apnea (adult) (pediatric): Secondary | ICD-10-CM | POA: Insufficient documentation

## 2023-06-20 ENCOUNTER — Encounter (INDEPENDENT_AMBULATORY_CARE_PROVIDER_SITE_OTHER): Payer: Self-pay | Admitting: Cardiology

## 2023-06-20 DIAGNOSIS — G4733 Obstructive sleep apnea (adult) (pediatric): Secondary | ICD-10-CM

## 2023-06-21 NOTE — Procedures (Signed)
     Patient Name: Susan Singleton, Susan Singleton Date: 06/19/2023 Gender: Female D.O.B: May 23, 1966 Age (years): 88 Referring Provider: Joni Reining NP Height (inches): 71 Interpreting Physician: Armanda Magic MD, ABSM Weight (lbs): 205 RPSGT: Elaina Pattee BMI: 29 MRN: [id number] Neck Size: 14.75  CLINICAL INFORMATION Sleep Study Type: HST  Indication for sleep study: N/A  Epworth Sleepiness Score: 1  SLEEP STUDY TECHNIQUE A multi-channel overnight portable sleep study was performed. The channels recorded were: nasal airflow, thoracic respiratory movement, and oxygen saturation with a pulse oximetry. Snoring was also monitored.  MEDICATIONS Patient self administered medications include: N/A.  SLEEP ARCHITECTURE Patient was studied for 321.5 minutes. The sleep efficiency was 96.8 % and the patient was supine for 0%. The arousal index was 0.0 per hour.  RESPIRATORY PARAMETERS The overall AHI was 11.8 per hour, with a central apnea index of 0 per hour.  The oxygen nadir was 92% during sleep.  CARDIAC DATA Mean heart rate during sleep was 67.8 bpm.  IMPRESSIONS - Mild obstructive sleep apnea occurred during this study (AHI = 11.8/h). - The patient had minimal or no oxygen desaturation during the study (Min O2 = 92%) - No snoring was audible during this study.  DIAGNOSIS - Obstructive Sleep Apnea (G47.33)  RECOMMENDATIONS - Therapeutic CPAP titration to determine optimal pressure required to alleviate sleep disordered breathing. - Positional therapy avoiding supine position during sleep. - Avoid alcohol, sedatives and other CNS depressants that may worsen sleep apnea and disrupt normal sleep architecture. - Sleep hygiene should be reviewed to assess factors that may improve sleep quality. - Weight management and regular exercise should be initiated or continued.  [Electronically signed] 06/21/2023 05:51 PM  Armanda Magic MD, ABSM Diplomate, American Board of Sleep  Medicine

## 2023-06-22 ENCOUNTER — Telehealth: Payer: Self-pay

## 2023-06-22 DIAGNOSIS — M0589 Other rheumatoid arthritis with rheumatoid factor of multiple sites: Secondary | ICD-10-CM | POA: Diagnosis not present

## 2023-06-22 NOTE — Telephone Encounter (Signed)
-----   Message from Armanda Magic sent at 06/21/2023  5:52 PM EST ----- Please let patient know that they have sleep apnea.  Recommend therapeutic CPAP titration for treatment of patient's sleep disordered breathing.

## 2023-06-22 NOTE — Telephone Encounter (Signed)
Left VM with callback number for sleep study results and recommendations.

## 2023-06-23 ENCOUNTER — Encounter: Payer: Self-pay | Admitting: Obstetrics and Gynecology

## 2023-06-23 DIAGNOSIS — Z08 Encounter for follow-up examination after completed treatment for malignant neoplasm: Secondary | ICD-10-CM | POA: Diagnosis not present

## 2023-06-23 DIAGNOSIS — Z853 Personal history of malignant neoplasm of breast: Secondary | ICD-10-CM | POA: Diagnosis not present

## 2023-06-23 LAB — COLOGUARD: COLOGUARD: NEGATIVE

## 2023-06-23 NOTE — Telephone Encounter (Signed)
Patient returned call. Patient stated that the device came off during the night and does not feel that she had adequate sleep because she was aware of the Study connected to her. Patient is willing to repeat Home Sleep Study or have referral for Oral Device.

## 2023-06-27 DIAGNOSIS — Z79899 Other long term (current) drug therapy: Secondary | ICD-10-CM | POA: Diagnosis not present

## 2023-06-27 DIAGNOSIS — M51369 Other intervertebral disc degeneration, lumbar region without mention of lumbar back pain or lower extremity pain: Secondary | ICD-10-CM | POA: Diagnosis not present

## 2023-06-27 DIAGNOSIS — M0589 Other rheumatoid arthritis with rheumatoid factor of multiple sites: Secondary | ICD-10-CM | POA: Diagnosis not present

## 2023-06-27 DIAGNOSIS — M8580 Other specified disorders of bone density and structure, unspecified site: Secondary | ICD-10-CM | POA: Diagnosis not present

## 2023-06-28 NOTE — Telephone Encounter (Signed)
Referral for Dr Toni Arthurs sent 06/27/23 for oral device.

## 2023-07-01 NOTE — Progress Notes (Signed)
 Cardiology Office Note:  .   Date:  07/04/2023  ID:  Susan Singleton, DOB [birth year], MRN [id number] PCP: Virgle Grime, MD  [facility] Providers Cardiologist:  Cody Das, MD  }   History of Present Illness: .   Susan Singleton is a 58 y.o. female with hx of palpitations.recent ZIO monitor report completed dated 9/20 5 through 9 27. This did reveal salvos of SVT, lasting up to 6 bpm with a maximum heart rate of 136 bpm. These are documented at 1:30 in the afternoon, and at 2:48 AM.  She was reluctant to use long-term beta-blocker therapy and therefore I gave her prescription for propranolol  20 mg as needed for rapid heart rhythm lasting greater than 30 minutes.  Repeat echocardiogram was ordered to evaluate for structural heart disease.  Also the patient was scheduled for home sleep study as she was having episodes of apnea during the night and daytime somnolence.  Home sleep study was done on 06/22/2023.  Report from Dr. Micael Adas, revealed mild obstructive sleep apnea patient had minimal or no oxygen desaturation no snoring was audible during the study.  She was recommended for therapeutic CPAP titration.   In speaking with the patient today she explained that she did not wear the home sleep study device throughout the night as it continued to shut off and she was unable to sleep.  The patient did call sleep study nurse twice during the night because of the malfunctioning.  She eventually took the device off.  The sleep study report may not be fully accurate due to inconsistency in wearing the monitor.  I have asked the sleep study nurse to come and talk with her about alternatives.  She did report this to her sleep study nurse by phone and options were discussed at that time.  Studies Reviewed: Aaron Aas   EKG Interpretation Date/Time:  Monday July 04 2023 15:41:04 EST Ventricular Rate:  78 PR Interval:  170 QRS Duration:  88 QT Interval:  410 QTC Calculation: 467 R  Axis:   89  Text Interpretation: Normal sinus rhythm Normal ECG When compared with ECG of 29-Mar-2023 10:51, No significant change was found Confirmed by Friddie Jetty [phone]) on 07/04/2023 5:29:04 PM  Sleep Study IMPRESSIONS - Mild obstructive sleep apnea occurred during this study (AHI = 11.8/h). - The patient had minimal or no oxygen desaturation during the study (Min O2 = 92%) - No snoring was audible during this study.   DIAGNOSIS - Obstructive Sleep Apnea (G47.33)   RECOMMENDATIONS - Therapeutic CPAP titration to determine optimal pressure required to alleviate sleep disordered breathing. - Positional therapy avoiding supine position during sleep. - Avoid alcohol, sedatives and other CNS depressants that may worsen sleep apnea and disrupt normal sleep architecture. - Sleep hygiene should be reviewed to assess factors that may improve sleep quality. - Weight management and regular exercise should be initiated or continued    Physical Exam:   VS:  BP 134/82 (BP Location: Right Arm, Patient Position: Sitting, Cuff Size: Normal)   Pulse 78   Ht 5\' 11"  (1.803 m)   Wt 209 lb (94.8 kg)   LMP 06/28/2013   SpO2 97%   BMI 29.15 kg/m    Wt Readings from Last 3 Encounters:  07/04/23 209 lb (94.8 kg)  05/31/23 214 lb (97.1 kg)  03/29/23 208 lb (94.3 kg)    GEN: Well nourished, well developed in no acute distress Limited assessment due to concerns over sleep study test and  CPAP titration test.  ASSESSMENT AND PLAN: .    Shortness of breath: Home sleep study was invalid due to malfunctioning machine and the need to disconnect it and therefore accuracy of results are in question.  She will have a second sleep study using a different device.  Calvin Caulk, LPN discussed this with her at length during her visit today.  There was a discussion about referral to ENT for an oral device but the patient wished to repeat the sleep study so that we would have an accurate assessment of her sleep  hygiene.  She will follow-up with Dr. Micael Adas concerning sleep study test and further recommendations.  Mrs. Rorie, LPN, spent about 20 minutes with this patient explaining other options as well as listening to her concerns.    2.  NSVT: The patient was reluctant to use long-term beta-blocker therapy and therefore she was given propranolol  20 mg as needed for rapid heart rhythm lasting greater than 30 minutes.  She states that this has subsided and not as prominent as before.  Echocardiogram was ordered but has not yet been completed.  Will need to discuss reorder on follow-up visit.   Signed, Conard Decent. Vallery Gavel, ANP, AACC

## 2023-07-04 ENCOUNTER — Ambulatory Visit: Payer: BC Managed Care – PPO | Attending: Adult Health | Admitting: Adult Health

## 2023-07-04 ENCOUNTER — Encounter: Payer: Self-pay | Admitting: Adult Health

## 2023-07-04 VITALS — BP 134/82 | HR 78 | Ht 71.0 in | Wt 209.0 lb

## 2023-07-04 DIAGNOSIS — R002 Palpitations: Secondary | ICD-10-CM | POA: Diagnosis not present

## 2023-07-04 DIAGNOSIS — G4733 Obstructive sleep apnea (adult) (pediatric): Secondary | ICD-10-CM

## 2023-07-04 NOTE — Patient Instructions (Signed)
 Medication Instructions:  No Changes *If you need a refill on your cardiac medications before your next appointment, please call your pharmacy*   Lab Work: No Labs If you have labs (blood work) drawn today and your tests are completely normal, you will receive your results only by: MyChart Message (if you have MyChart) OR A paper copy in the mail If you have any lab test that is abnormal or we need to change your treatment, we will call you to review the results.   Testing/Procedures: WatchPAT?  Is a FDA cleared portable home sleep study test that uses a watch and 3 points of contact to monitor 7 different channels, including your heart rate, oxygen saturations, body position, snoring, and chest motion.  The study is easy to use from the comfort of your own home and accurately detect sleep apnea.  Before bed, you attach the chest sensor, attached the sleep apnea bracelet to your nondominant hand, and attach the finger probe.  After the study, the raw data is downloaded from the watch and scored for apnea events.   For more information: [email or web address]  Patient Testing Instructions:  Do not put battery into the device until bedtime when you are ready to begin the test. Please call the support number if you need assistance after following the instructions below: 24 hour support line- [phone] or [facility] support at [phone] (option 2)  Download the IntelWatchPAT One" app through the google play store or App Store  Be sure to turn on or enable access to bluetooth in settlings on your smartphone/ device  Make sure no other bluetooth devices are on and within the vicinity of your smartphone/ device and WatchPAT watch during testing.  Make sure to leave your smart phone/ device plugged in and charging all night.  When ready for bed:  Follow the instructions step by step in the WatchPAT One App to activate the testing device. For additional instructions, including  video instruction, visit the WatchPAT One video on Youtube. You can search for WatchPat One within Youtube (video is 4 minutes and 18 seconds) or enter: https://youtube/watch?[email or web address]_vbiwxE Please note: You will be prompted to enter a Pin to connect via bluetooth when starting the test. The PIN will be assigned to you when you receive the test.  The device is disposable, but it recommended that you retain the device until you receive a call letting you know the study has been received and the results have been interpreted.  We will let you know if the study did not transmit to us  properly after the test is completed. You do not need to call us  to confirm the receipt of the test.  Please complete the test within 48 hours of receiving PIN.   Frequently Asked Questions:  What is Watch Deatra Face one?  A single use fully disposable home sleep apnea testing device and will not need to be returned after completion.  What are the requirements to use WatchPAT one?  The be able to have a successful watchpat one sleep study, you should have your Watch pat one device, your smart phone, watch pat one app, your PIN number and Internet access What type of phone do I need?  You should have a smart phone that uses Android 5.1 and above or any Iphone with IOS 10 and above How can I download the WatchPAT one app?  Based on your device type search for WatchPAT one app either in google play for android devices or APP store for Iphone's  Where will I get my PIN for the study?  Your PIN will be provided by your physician's office. It is used for authentication and if you lose/forget your PIN, please reach out to your providers office.  I do not have Internet at home. Can I do WatchPAT one study?  WatchPAT One needs Internet connection throughout the night to be able to transmit the sleep data. You can use your home/local internet or your cellular's data package. However, it is always recommended to use home/local Internet. It is  estimated that between 20MB-30MB will be used with each study.However, the application will be looking for space in the phone to start the study.  What happens if I lose internet or bluetooth connection?  During the internet disconnection, your phone will not be able to transmit the sleep data. All the data, will be stored in your phone. As soon as the internet connection is back on, the phone will being sending the sleep data. During the bluetooth disconnection, WatchPAT one will not be able to to send the sleep data to your phone. Data will be kept in the WatchPAT one until two devices have bluetooth connection back on. As soon as the connection is back on, WatchPAT one will send the sleep data to the phone.  How long do I need to wear the WatchPAT one?  After you start the study, you should wear the device at least 6 hours.  How far should I keep my phone from the device?  During the night, your phone should be within 15 feet.  What happens if I leave the room for restroom or other reasons?  Leaving the room for any reason will not cause any problem. As soon as your get back to the room, both devices will reconnect and will continue to send the sleep data. Can I use my phone during the sleep study?  Yes, you can use your phone as usual during the study. But it is recommended to put your watchpat one on when you are ready to go to bed.  How will I get my study results?  A soon as you completed your study, your sleep data will be sent to the provider. They will then share the results with you when they are ready.      Follow-Up: At [facility], you and your health needs are our priority.  As part of our continuing mission to provide you with exceptional heart care, we have created designated Provider Care Teams.  These Care Teams include your primary Cardiologist (physician) and Advanced Practice Providers (APPs -  Physician Assistants and Nurse Practitioners) who all work together  to provide you with the care you need, when you need it.  We recommend signing up for the patient portal called "MyChart".  Sign up information is provided on this After Visit Summary.  MyChart is used to connect with patients for Virtual Visits (Telemedicine).  Patients are able to view lab/test results, encounter notes, upcoming appointments, etc.  Non-urgent messages can be sent to your provider as well.   To learn more about what you can do with MyChart, go to [email or web address].    Your next appointment:   6 month(s)  Provider:   Cody Das, MD

## 2023-07-21 DIAGNOSIS — M0589 Other rheumatoid arthritis with rheumatoid factor of multiple sites: Secondary | ICD-10-CM | POA: Diagnosis not present

## 2023-08-02 DIAGNOSIS — H04123 Dry eye syndrome of bilateral lacrimal glands: Secondary | ICD-10-CM | POA: Diagnosis not present

## 2023-08-02 DIAGNOSIS — H40013 Open angle with borderline findings, low risk, bilateral: Secondary | ICD-10-CM | POA: Diagnosis not present

## 2023-08-02 DIAGNOSIS — M069 Rheumatoid arthritis, unspecified: Secondary | ICD-10-CM | POA: Diagnosis not present

## 2023-08-02 DIAGNOSIS — H2513 Age-related nuclear cataract, bilateral: Secondary | ICD-10-CM | POA: Diagnosis not present

## 2023-08-18 DIAGNOSIS — M0589 Other rheumatoid arthritis with rheumatoid factor of multiple sites: Secondary | ICD-10-CM | POA: Diagnosis not present

## 2023-08-24 DIAGNOSIS — S29011A Strain of muscle and tendon of front wall of thorax, initial encounter: Secondary | ICD-10-CM | POA: Diagnosis not present

## 2023-09-15 DIAGNOSIS — M0589 Other rheumatoid arthritis with rheumatoid factor of multiple sites: Secondary | ICD-10-CM | POA: Diagnosis not present

## 2023-09-15 DIAGNOSIS — R5383 Other fatigue: Secondary | ICD-10-CM | POA: Diagnosis not present

## 2023-09-22 DIAGNOSIS — H40013 Open angle with borderline findings, low risk, bilateral: Secondary | ICD-10-CM | POA: Diagnosis not present

## 2023-09-22 DIAGNOSIS — H2513 Age-related nuclear cataract, bilateral: Secondary | ICD-10-CM | POA: Diagnosis not present

## 2023-09-22 DIAGNOSIS — H04123 Dry eye syndrome of bilateral lacrimal glands: Secondary | ICD-10-CM | POA: Diagnosis not present

## 2023-09-22 DIAGNOSIS — M069 Rheumatoid arthritis, unspecified: Secondary | ICD-10-CM | POA: Diagnosis not present

## 2023-09-26 DIAGNOSIS — M8580 Other specified disorders of bone density and structure, unspecified site: Secondary | ICD-10-CM | POA: Diagnosis not present

## 2023-09-26 DIAGNOSIS — M51369 Other intervertebral disc degeneration, lumbar region without mention of lumbar back pain or lower extremity pain: Secondary | ICD-10-CM | POA: Diagnosis not present

## 2023-09-26 DIAGNOSIS — Z79899 Other long term (current) drug therapy: Secondary | ICD-10-CM | POA: Diagnosis not present

## 2023-09-26 DIAGNOSIS — M0589 Other rheumatoid arthritis with rheumatoid factor of multiple sites: Secondary | ICD-10-CM | POA: Diagnosis not present

## 2023-10-10 DIAGNOSIS — R0982 Postnasal drip: Secondary | ICD-10-CM | POA: Diagnosis not present

## 2023-10-10 DIAGNOSIS — H8110 Benign paroxysmal vertigo, unspecified ear: Secondary | ICD-10-CM | POA: Diagnosis not present

## 2023-10-10 NOTE — Progress Notes (Signed)
 Subjective:   Chief Complaint  Patient presents with  . Dizziness    Pt states her vertigo began this morning suddenly. Pt is getting ready to go on a trip tomorrow. Pt states she was packing and all of the sudden the vertigo hit her. Pt has not taken any OTC meds for her symptoms      History of Present Illness The patient presents for evaluation of dizziness.  She experienced a sudden onset of dizziness this morning, similar to previous vertigo episodes, without nausea. She reports a mild sore throat, postnasal drainage, and ear sensitivity starting as well. Dizziness was most notable when lying flat. Concerned as she has an upcoming flight to [place]  tomorrow. Her last vertigo episode was though to be due to inner ear inflammation Has not tried any meds for relief.   Parts of patient history reviewed include PMH, problem list, medications, allergies, and social history.  Objective:   Vitals:   10/10/23 1239 10/10/23 1242 10/10/23 1244  BP:  156/80 151/85  BP Location:  Right arm Right arm  Patient Position:  Sitting Standing  Pulse:  70 76  Resp: 18    Temp: 98.1 F (36.7 C)    TempSrc: Oral    SpO2: 100%    Weight: 92.5 kg (204 lb)    Height: 1.803 m (5' 11)      Physical Exam Vitals reviewed.  Constitutional:      Appearance: Normal appearance.  HENT:     Head: Normocephalic and atraumatic.     Right Ear: Tympanic membrane, ear canal and external ear normal. There is impacted cerumen.     Left Ear: Tympanic membrane, ear canal and external ear normal.     Nose: Nose normal.     Mouth/Throat:     Mouth: Mucous membranes are moist.     Pharynx: No oropharyngeal exudate or posterior oropharyngeal erythema.  Eyes:     Extraocular Movements: Extraocular movements intact.  Cardiovascular:     Rate and Rhythm: Normal rate and regular rhythm.     Pulses: Normal pulses.     Heart sounds: Normal heart sounds.  Pulmonary:     Effort: Pulmonary effort is normal.      Breath sounds: Normal breath sounds.  Musculoskeletal:        General: Normal range of motion.     Cervical back: Normal range of motion and neck supple.  Lymphadenopathy:     Cervical: No cervical adenopathy.  Skin:    General: Skin is warm and dry.  Neurological:     General: No focal deficit present.     Mental Status: She is alert and oriented to person, place, and time.     Cranial Nerves: No cranial nerve deficit.     Sensory: No sensory deficit.     Motor: No weakness.     Coordination: Coordination normal.     Gait: Gait normal.            Assessment/Plan:   Susan Singleton was seen today for dizziness.  Diagnoses and all orders for this visit:  Benign paroxysmal positional vertigo, unspecified laterality -     meclizine (ANTIVERT) 25 mg tablet; Take 1/2-1 tablet by mouth every 6-8 hours as needed for vertigo  Post-nasal drip -     pseudoePHEDrine (SUDAFED) 30 mg tablet; Take 1 tablet (30 mg total) by mouth every 4 (four) hours as needed for congestion. Take before flying on plane -     fluticasone propionate (  FLONASE) 50 mcg/spray nasal spray; Administer 2 sprays into each nostril daily.     Assessment & Plan Susan Singleton is a 58 y.o. female is here for acute onset of dizziness when packing for her trip, seems worse when lying flat, similar to previous episodes of vertigo  Neuro exam reveals no deficits. No evidence of central vertigo, TIA, CVA. At present symptoms not reproducible with [facility]. Notes associated new mild sore throat and post nasal drip starting as well. Exam reveals no evidence of OM nor significant ear effusions. Feel OM, labyrinthitis and serous OM unlikely. Overall clinical picture most c/w peripheral vertigo, consider early viral URI as well. Recommend taking meclizine as needed and to start flonase to help with any underlying eustachian tube dysfunction and with post nasal drip symptoms. Advised to take a sudafed prior to flying to reduce any ear  fullness during flight.   Patient will return for new or worsening symptoms. I discussed the findings today, diagnosis/differential diagnosis, plan and red flags that require return for reevaluation with PCP, Urgent care or EMERGENCY. Patient was agreeable to outlined plan and questions were answered, felt stable for discharge.    All pertinent previous records reviewed prior to and during visit.   Home Care   Electronically signed by: Jon Earnie Flank, NP 10/10/2023 1:31 PM

## 2023-10-12 ENCOUNTER — Encounter: Payer: Self-pay | Admitting: Adult Health

## 2023-10-20 DIAGNOSIS — M0589 Other rheumatoid arthritis with rheumatoid factor of multiple sites: Secondary | ICD-10-CM | POA: Diagnosis not present

## 2023-11-15 ENCOUNTER — Other Ambulatory Visit: Payer: Self-pay

## 2023-11-15 ENCOUNTER — Emergency Department (HOSPITAL_COMMUNITY)

## 2023-11-15 ENCOUNTER — Emergency Department (HOSPITAL_COMMUNITY)
Admission: EM | Admit: 2023-11-15 | Discharge: 2023-11-16 | Disposition: A | Discharge status: Home / Self Care | Attending: Emergency Medicine | Admitting: Emergency Medicine

## 2023-11-15 DIAGNOSIS — W182XXA Fall in (into) shower or empty bathtub, initial encounter: Secondary | ICD-10-CM | POA: Diagnosis not present

## 2023-11-15 DIAGNOSIS — S52611A Displaced fracture of right ulna styloid process, initial encounter for closed fracture: Secondary | ICD-10-CM | POA: Insufficient documentation

## 2023-11-15 DIAGNOSIS — S52354A Nondisplaced comminuted fracture of shaft of radius, right arm, initial encounter for closed fracture: Secondary | ICD-10-CM | POA: Diagnosis not present

## 2023-11-15 DIAGNOSIS — S52591A Other fractures of lower end of right radius, initial encounter for closed fracture: Secondary | ICD-10-CM | POA: Insufficient documentation

## 2023-11-15 DIAGNOSIS — S52501A Unspecified fracture of the lower end of right radius, initial encounter for closed fracture: Secondary | ICD-10-CM | POA: Diagnosis not present

## 2023-11-15 DIAGNOSIS — M25531 Pain in right wrist: Secondary | ICD-10-CM | POA: Diagnosis not present

## 2023-11-15 NOTE — ED Provider Triage Note (Signed)
 Emergency Medicine Provider Triage Evaluation Note  Susan Singleton , a 58 y.o. female  was evaluated in triage.  Pt complains of fall that occurred just prior to arrival.  She fell backwards and tried to catch herself with her right hand.  Complains of pain and swelling to the right wrist.  No head injury.  No other injury..  Review of Systems  Positive: As above Negative: As above  Physical Exam  LMP 06/28/2013  Gen:   Awake, no distress   Resp:  Normal effort  MSK:   Moves extremities without difficulty Other:    Medical Decision Making  Medically screening exam initiated at 8:11 PM.  Appropriate orders placed.  GODDESS GEBBIA was informed that the remainder of the evaluation will be completed by another provider, this initial triage assessment does not replace that evaluation, and the importance of remaining in the ED until their evaluation is complete.    Hildegard Loge, PA-C 11/15/23 2012

## 2023-11-15 NOTE — ED Provider Notes (Incomplete)
 [place] EMERGENCY DEPARTMENT AT [facility] Provider Note   CSN: [id number] Arrival date & time: 11/15/23  8042     Patient presents with: Right Wrist Injury   Susan Singleton is a 58 y.o. female who presents to the ED for evaluation of injury to her right wrist.  States that she was stepping out of the shower this afternoon when she fell landing on her right wrist.  She reports immediate pain and swelling of her right wrist at this time.  Denies preceding chest pain or shortness of breath.  Denies hitting her head or losing consciousness.  No blood thinners.  Reports pain in right wrist.  Denies any pain in her right elbow.  No medication prior to arrival.   HPI     Prior to Admission medications   Medication Sig Start Date End Date Taking? Authorizing Provider  abatacept  (ORENCIA ) 250 MG injection Inject by intravenous route.    [provider]  Cyanocobalamin ([employer] VITAMIN B-12 PO) Take by mouth.    [provider]  Ergocalciferol 50 MCG (2000 UT) CAPS Take 2,000 Units by mouth daily.    [provider]  folic acid (FOLVITE) 1 MG tablet Take 1 mg by mouth daily.    [provider]  ibuprofen (ADVIL) 600 MG tablet Take 600 mg by mouth 3 (three) times daily. 01/09/21   [provider]  methotrexate (RHEUMATREX) 2.5 MG tablet Take 2.5 mg by mouth once a week. Caution:Chemotherapy. Protect from light.    [provider]  predniSONE (DELTASONE) 5 MG tablet prednisone 5 mg tablet   1 tablet every day by oral route. 12/01/09   [provider]  traMADol (ULTRAM) 50 MG tablet Take 50 mg by mouth every 12 (twelve) hours as needed.     [provider]    Allergies: Oxycodone-acetaminophen    Review of Systems  Updated Vital Signs BP (!) 179/81 (BP Location: Right Leg)   Pulse 61   Temp 98.1 F (36.7 C) (Oral)   Resp 20   LMP 06/28/2013   SpO2 100%   Physical Exam  (all labs ordered are listed, but  only abnormal results are displayed) Labs Reviewed - No data to display  EKG: None  Radiology: DG Wrist Complete Right Result Date: 11/15/2023 CLINICAL DATA:  Fall EXAM: RIGHT WRIST - COMPLETE 3+ VIEW COMPARISON:  None Available. FINDINGS: There is an acute comminuted fracture of the distal radius which is nondisplaced. There is a oblique oriented component of the fracture extending into the diaphysis, also nondisplaced. Displaced ulnar styloid fracture present. No dislocation. There is soft tissue swelling surrounding the wrist. IMPRESSION: 1. Acute comminuted fracture of the distal radius which is nondisplaced. 2. Displaced ulnar styloid fracture. Electronically Signed   By: Greig Pique M.D.   On: 11/15/2023 20:41    {Document cardiac monitor, telemetry assessment procedure when appropriate:32947} Procedures   Medications Ordered in the ED - No data to display    {Click here for ABCD2, HEART and other calculators REFRESH Note before signing:1}                              Medical Decision Making  ***  {Document critical care time when appropriate  Document review of labs and clinical decision tools ie CHADS2VASC2, etc  Document your independent review of radiology images and any outside records  Document your discussion with family members, caretakers and with  consultants  Document social determinants of health affecting pt's care  Document your decision making why or why not admission, treatments were needed:32947:::1}   Final diagnoses:  None    ED Discharge Orders     None

## 2023-11-15 NOTE — ED Provider Notes (Signed)
 [place] EMERGENCY DEPARTMENT AT [facility] Provider Note   CSN: [id number] Arrival date & time: 11/15/23  8042     Patient presents with: Right Wrist Injury   Susan Singleton is a 58 y.o. female who presents to the ED for evaluation of injury to her right wrist.  States that she was stepping out of the shower this afternoon when she fell landing on her right wrist.  She reports immediate pain and swelling of her right wrist at this time.  Denies preceding chest pain or shortness of breath.  Denies hitting her head or losing consciousness.  No blood thinners.  Reports pain in right wrist.  Denies any pain in her right elbow.  No medication prior to arrival.   HPI     Prior to Admission medications   Medication Sig Start Date End Date Taking? Authorizing Provider  HYDROcodone-acetaminophen (NORCO/VICODIN) 5-325 MG tablet Take 1 tablet by mouth every 4 (four) hours as needed. 11/16/23  Yes Ruthell Lonni FALCON, PA-C  abatacept  (ORENCIA ) 250 MG injection Inject by intravenous route.    [provider]  Cyanocobalamin (CVS VITAMIN B-12 PO) Take by mouth.    [provider]  Ergocalciferol 50 MCG (2000 UT) CAPS Take 2,000 Units by mouth daily.    [provider]  folic acid (FOLVITE) 1 MG tablet Take 1 mg by mouth daily.    [provider]  ibuprofen (ADVIL) 600 MG tablet Take 600 mg by mouth 3 (three) times daily. 01/09/21   [provider]  methotrexate (RHEUMATREX) 2.5 MG tablet Take 2.5 mg by mouth once a week. Caution:Chemotherapy. Protect from light.    [provider]  predniSONE (DELTASONE) 5 MG tablet prednisone 5 mg tablet   1 tablet every day by oral route. 12/01/09   [provider]  traMADol (ULTRAM) 50 MG tablet Take 50 mg by mouth every 12 (twelve) hours as needed.     [provider]    Allergies: Oxycodone-acetaminophen    Review of Systems  Musculoskeletal:  Positive for arthralgias.  All  other systems reviewed and are negative.   Updated Vital Signs BP (!) 179/81 (BP Location: Right Leg)   Pulse 61   Temp 98.1 F (36.7 C) (Oral)   Resp 20   LMP 06/28/2013   SpO2 100%   Physical Exam Vitals and nursing note reviewed.  Constitutional:      General: She is not in acute distress.    Appearance: She is well-developed.  HENT:     Head: Normocephalic and atraumatic.   Eyes:     Conjunctiva/sclera: Conjunctivae normal.    Cardiovascular:     Rate and Rhythm: Normal rate and regular rhythm.     Pulses:          Radial pulses are 2+ on the right side.     Heart sounds: No murmur heard. Pulmonary:     Effort: Pulmonary effort is normal. No respiratory distress.     Breath sounds: Normal breath sounds.  Abdominal:     Palpations: Abdomen is soft.     Tenderness: There is no abdominal tenderness.   Musculoskeletal:        General: Swelling present.     Cervical back: Neck supple.     Comments: Marked swelling of right wrist.  2+ radial pulse.  Brisk cap refill.  Neurovascularly intact to right hand.  Reduced range of motion of right wrist secondary to swelling.   Skin:  General: Skin is warm and dry.     Capillary Refill: Capillary refill takes less than 2 seconds.   Neurological:     Mental Status: She is alert and oriented to person, place, and time. Mental status is at baseline.   Psychiatric:        Mood and Affect: Mood normal.     (all labs ordered are listed, but only abnormal results are displayed) Labs Reviewed - No data to display  EKG: None  Radiology: DG Wrist Complete Right Result Date: 11/15/2023 CLINICAL DATA:  Fall EXAM: RIGHT WRIST - COMPLETE 3+ VIEW COMPARISON:  None Available. FINDINGS: There is an acute comminuted fracture of the distal radius which is nondisplaced. There is a oblique oriented component of the fracture extending into the diaphysis, also nondisplaced. Displaced ulnar styloid fracture present. No dislocation. There  is soft tissue swelling surrounding the wrist. IMPRESSION: 1. Acute comminuted fracture of the distal radius which is nondisplaced. 2. Displaced ulnar styloid fracture. Electronically Signed   By: Greig Pique M.D.   On: 11/15/2023 20:41     Procedures   Medications Ordered in the ED - No data to display                                Medical Decision Making  58 year old female presents for evaluation.  Please see HPI for further details.  On examination the patient is afebrile and nontachycardic.  Her lung sounds are clear bilaterally, she is not hypoxic.  Abdomen is soft and compressible.  Neurological examination at baseline.  Patient right wrist with marked swelling, no deformity.  She has 2+ radial pulse.  Brisk cap refill.  Neurovascular intact to right hand.  Reduced range of motion to the right wrist secondary to swelling.  X-ray imaging reveals acute comminuted fracture of the distal radius which is nondisplaced.  Also noted to have displaced ulnar styloid fracture.  Patient placed in sugar-tong splint at this time.  Given shoulder sling.  Patient will have follow-up with hand surgeon, Dr. Romona.  Patient was advised to return to the ED with any new or worsening symptoms.  She was prescribed pain medication.  Advised to attempt control pain with ibuprofen or Tylenol initially.  She voiced understanding.  Return precautions given and she voiced understanding.  Stable to discharge  Final diagnoses:  Closed fracture of distal end of right radius, unspecified fracture morphology, initial encounter  Closed displaced fracture of styloid process of right ulna, initial encounter    ED Discharge Orders          Ordered    HYDROcodone-acetaminophen (NORCO/VICODIN) 5-325 MG tablet  Every 4 hours PRN        11/16/23 0024               Ruthell Lonni FALCON, PA-C 11/16/23 BEATRIS Raford Lenis, MD 11/16/23 0403

## 2023-11-15 NOTE — ED Triage Notes (Signed)
 Patient lost her balance while stepping out of the shower and fell , presents with right wrist swelling , denies LOC , no anticoagulant medication , ambulatory .

## 2023-11-16 MED ORDER — HYDROCODONE-ACETAMINOPHEN 5-325 MG PO TABS: 1.0000 | ORAL_TABLET | ORAL | 0 refills | Status: AC | PRN

## 2023-11-16 NOTE — Progress Notes (Signed)
 Orthopedic Tech Progress Note Patient Details:  Susan Singleton Mar 06, 1966 [id number]  Ortho Devices Type of Ortho Device: Sugartong splint, Sling immobilizer Ortho Device/Splint Location: RUE Ortho Device/Splint Interventions: Ordered, Application, Adjustment   Post Interventions Patient Tolerated: Well Instructions Provided: Care of device  [place] A Wonda 11/16/2023, 12:07 AM

## 2023-11-16 NOTE — Discharge Instructions (Addendum)
 It was a pleasure taking part in your care.  As discussed, you have a distal radial fracture and an ulnar styloid fracture.  Please follow-up with Dr. Romona of hand surgery.  Please call make an appointment to be seen.  You may take ibuprofen or Tylenol initially to control pain and if this does not relieve pain, please proceed to hydrocodone pain medication.  Please do not drive, operate heavy machinery or drink taking this medication.  Return to the ED with any new or worsening symptoms.

## 2023-11-17 DIAGNOSIS — S52501A Unspecified fracture of the lower end of right radius, initial encounter for closed fracture: Secondary | ICD-10-CM | POA: Diagnosis not present

## 2023-11-18 ENCOUNTER — Telehealth: Payer: Self-pay

## 2023-11-18 ENCOUNTER — Telehealth: Payer: Self-pay | Admitting: *Deleted

## 2023-11-18 NOTE — Telephone Encounter (Signed)
 Patient has been scheduled for televisit med rec and consent done     Patient Consent for Virtual Visit         [name] has provided verbal consent on 11/18/2023 for a virtual visit (video or telephone).   CONSENT FOR VIRTUAL VISIT FOR:  Susan Singleton  By participating in this virtual visit I agree to the following:  I hereby voluntarily request, consent and authorize [facility] and its employed or contracted physicians, physician assistants, nurse practitioners or other licensed health care professionals (the Practitioner), to provide me with telemedicine health care services (the "Services) as deemed necessary by the treating Practitioner. I acknowledge and consent to receive the Services by the Practitioner via telemedicine. I understand that the telemedicine visit will involve communicating with the Practitioner through live audiovisual communication technology and the disclosure of certain medical information by electronic transmission. I acknowledge that I have been given the opportunity to request an in-person assessment or other available alternative prior to the telemedicine visit and am voluntarily participating in the telemedicine visit.  I understand that I have the right to withhold or withdraw my consent to the use of telemedicine in the course of my care at any time, without affecting my right to future care or treatment, and that the Practitioner or I may terminate the telemedicine visit at any time. I understand that I have the right to inspect all information obtained and/or recorded in the course of the telemedicine visit and may receive copies of available information for a reasonable fee.  I understand that some of the potential risks of receiving the Services via telemedicine include:  Delay or interruption in medical evaluation due to technological equipment failure or disruption; Information transmitted may not be sufficient (e.g. poor resolution of images) to  allow for appropriate medical decision making by the Practitioner; and/or  In rare instances, security protocols could fail, causing a breach of personal health information.  Furthermore, I acknowledge that it is my responsibility to provide information about my medical history, conditions and care that is complete and accurate to the best of my ability. I acknowledge that Practitioner's advice, recommendations, and/or decision may be based on factors not within their control, such as incomplete or inaccurate data provided by me or distortions of diagnostic images or specimens that may result from electronic transmissions. I understand that the practice of medicine is not an exact science and that Practitioner makes no warranties or guarantees regarding treatment outcomes. I acknowledge that a copy of this consent can be made available to me via my patient portal [facility] MyChart), or I can request a printed copy by calling the office of [facility].    I understand that my insurance will be billed for this visit.   I have read or had this consent read to me. I understand the contents of this consent, which adequately explains the benefits and risks of the Services being provided via telemedicine.  I have been provided ample opportunity to ask questions regarding this consent and the Services and have had my questions answered to my satisfaction. I give my informed consent for the services to be provided through the use of telemedicine in my medical care

## 2023-11-18 NOTE — Telephone Encounter (Signed)
Patient has been scheduled for televisit.

## 2023-11-18 NOTE — Telephone Encounter (Signed)
   Pre-operative Risk Assessment    Patient Name: Susan Singleton  DOB: 1965-09-27 MRN: [id number]   Date of last office visit: 07/04/23 [name], DNP Date of next office visit: NONE   Request for Surgical Clearance    Procedure:  RIGHT WRIST OPEN REDUCTION INTERNAL FIXATION   Date of Surgery:  Clearance TBD ASAP PER FORM                               Surgeon:  DR. LYNWOOD RIAS Surgeon's Group or Practice Name:  [facility] Phone number:  [phone] [name] Fax number:  [phone]   Type of Clearance Requested:   - Medical ; NONE INDICATED ON FORM TO BE HELD   Type of Anesthesia:  MAC & BLOCK    Additional requests/questions:    [name]   11/18/2023, 11:56 AM

## 2023-11-18 NOTE — Telephone Encounter (Signed)
   Name: Susan Singleton  DOB: August 28, 1965  MRN: [id number]  Primary Cardiologist: Newman JINNY Mariama Saintvil, MD   Preoperative team, please contact this patient and set up a phone call appointment for further preoperative risk assessment. Please obtain consent and complete medication review. Last seen by [name] NP on 07/04/2023. Thank you for your help.  I confirm that guidance regarding antiplatelet and oral anticoagulation therapy has been completed and, if necessary, noted below. She is not on anticoagulation or antiplatelet therapy per chart review.   I also confirmed the patient resides in the state of [place] . As per [facility] Board telemedicine laws, the patient must reside in the state in which the provider is licensed.   [name], NP 11/18/2023, 2:50 PM [facility]

## 2023-11-22 DIAGNOSIS — S93491A Sprain of other ligament of right ankle, initial encounter: Secondary | ICD-10-CM | POA: Diagnosis not present

## 2023-11-23 DIAGNOSIS — M0589 Other rheumatoid arthritis with rheumatoid factor of multiple sites: Secondary | ICD-10-CM | POA: Diagnosis not present

## 2023-11-23 DIAGNOSIS — Z01818 Encounter for other preprocedural examination: Secondary | ICD-10-CM | POA: Diagnosis not present

## 2023-11-23 DIAGNOSIS — I499 Cardiac arrhythmia, unspecified: Secondary | ICD-10-CM | POA: Diagnosis not present

## 2023-11-23 DIAGNOSIS — S52501P Unspecified fracture of the lower end of right radius, subsequent encounter for closed fracture with malunion: Secondary | ICD-10-CM | POA: Diagnosis not present

## 2023-11-24 ENCOUNTER — Ambulatory Visit: Attending: Cardiology | Admitting: Emergency Medicine

## 2023-11-24 DIAGNOSIS — Z01810 Encounter for preprocedural cardiovascular examination: Secondary | ICD-10-CM | POA: Diagnosis not present

## 2023-11-24 NOTE — Progress Notes (Signed)
 Virtual Visit via Telephone Note   Because of Susan Singleton co-morbid illnesses, she is at least at moderate risk for complications without adequate follow up.  This format is felt to be most appropriate for this patient at this time.  Due to technical limitations with video connection Web designer), today's appointment will be conducted as an audio only telehealth visit, and VERA FURNISS verbally agreed to proceed in this manner.   All issues noted in this document were discussed and addressed.  No physical exam could be performed with this format.  Evaluation Performed:  Preoperative cardiovascular risk assessment _____________   Date:  11/24/2023   Patient ID:  Susan Singleton, DOB 11-May-1966, MRN [id number] Patient Location:  Home Provider location:   Office  Primary Care Provider:  Verdia Lombard, MD Primary Cardiologist:  Newman JINNY Lawrence, MD  Chief Complaint / Patient Profile   58 y.o. y/o female with a h/o palpitations, NSVT, rheumatoid arthritis, history of breast cancer s/p left breast lumpectomy and radiation, former smoker who is pending right wrist open reduction internal fixation with [facility] on date TBD and presents today for telephonic preoperative cardiovascular risk assessment.  History of Present Illness    Susan Singleton is a 58 y.o. female who presents via audio/video conferencing for a telehealth visit today.  Pt was last seen in cardiology clinic on 07/04/2023 by [name], NP.  At that time CAMREN HENTHORN was doing well.  The patient is now pending procedure as outlined above. Since her last visit, she denies chest pain, shortness of breath, lower extremity edema, fatigue, palpitations, melena, hematuria, hemoptysis, diaphoresis, weakness, presyncope, syncope, orthopnea, and PND.  Today patient is doing well overall.  She is without acute cardiovascular concerns or complaints at this time.  She denies any palpitations or tachycardia over the past 2 to 3  months.  Her activity level is somewhat limited due to her history of rheumatoid arthritis.  However, she does continue to walk daily and is able to complete housework without limitation.  She also enjoys dancing when she goes on vacations.  Overall she is easily able to complete greater than 4 METS.  Past Medical History    Past Medical History:  Diagnosis Date   Arthritis    BRCA negative 10/04/2013   negative/[facility]   Breast cancer (HCC) 2015   Left breast - radiation   Cancer (HCC) 08/2013   BRACA negative, 2 mm ER neg, PR neg, Her 2 neu amplified cancer with extensive DCIS, T1a,N0,M0.   Fall 09/24/2013   H/O fibromyalgia    Personal history of radiation therapy    Screening for colon cancer 11/2019   neg Cologuard, repeat after 3 yrs   Skin cancer    Right leg   Thyroid  disease    Past Surgical History:  Procedure Laterality Date   BREAST BIOPSY Left 2015   positive   BREAST LUMPECTOMY Left 2015   BREAST SURGERY Left 09-17-13   INVASIVE MAMMARY CARCINOMA OF NO SPECIAL TYPE,/DCIS    BREAST SURGERY Left 10/18/13   Wide excision, mastoplasty. Sentinel node biopsy.   CYST REMOVAL LEG Right 2000   SALPINGOOPHORECTOMY     dermoid cyst RT    Allergies  Allergies  Allergen Reactions   Oxycodone-Acetaminophen  Other (See Comments)    Home Medications    Prior to Admission medications   Medication Sig Start Date End Date Taking? Authorizing Provider  abatacept  (ORENCIA ) 250 MG injection Inject by intravenous route.  [provider]  Cyanocobalamin ([employer] VITAMIN B-12 PO) Take by mouth.    [provider]  Ergocalciferol 50 MCG (2000 UT) CAPS Take 2,000 Units by mouth daily.    [provider]  folic acid (FOLVITE) 1 MG tablet Take 1 mg by mouth daily.    [provider]  HYDROcodone -acetaminophen  (NORCO/VICODIN) 5-325 MG tablet Take 1 tablet by mouth every 4 (four) hours as needed. 11/16/23   Ruthell Lonni FALCON, PA-C  ibuprofen (ADVIL)  600 MG tablet Take 600 mg by mouth 3 (three) times daily. 01/09/21   [provider]  methotrexate (RHEUMATREX) 2.5 MG tablet Take 2.5 mg by mouth once a week. Caution:Chemotherapy. Protect from light.    [provider]  predniSONE (DELTASONE) 5 MG tablet prednisone 5 mg tablet   1 tablet every day by oral route. 12/01/09   [provider]  traMADol (ULTRAM) 50 MG tablet Take 50 mg by mouth every 12 (twelve) hours as needed.     [provider]    Physical Exam    Vital Signs:  [name] does not have vital signs available for review today.  Given telephonic nature of communication, physical exam is limited. AAOx3. NAD. Normal affect.  Speech and respirations are unlabored.  Accessory Clinical Findings    None  Assessment & Plan    1.  Preoperative Cardiovascular Risk Assessment: According to the Revised Cardiac Risk Index (RCRI), her Perioperative Risk of Major Cardiac Event is (%): 0.4. Her Functional Capacity in METs is: 5.81 according to the [facility] Activity Status Index (DASI). Therefore, based on ACC/AHA guidelines, patient would be at acceptable risk for the planned procedure without further cardiovascular testing. I will route this recommendation to the requesting party via Epic fax function.  The patient was advised that if she develops new symptoms prior to surgery to contact our office to arrange for a follow-up visit, and she verbalized understanding.   A copy of this note will be routed to requesting surgeon.  Time:   Today, I have spent 9 minutes with the patient with telehealth technology discussing medical history, symptoms, and management plan.     Lum LITTIE Louis, NP  11/24/2023, 9:51 AM

## 2023-11-28 NOTE — Telephone Encounter (Signed)
 Rosina at Dr Nena Riding says the patient never showed.

## 2023-12-02 ENCOUNTER — Encounter: Payer: Self-pay | Admitting: Adult Health

## 2023-12-06 DIAGNOSIS — S52571A Other intraarticular fracture of lower end of right radius, initial encounter for closed fracture: Secondary | ICD-10-CM | POA: Diagnosis not present

## 2023-12-06 DIAGNOSIS — Y999 Unspecified external cause status: Secondary | ICD-10-CM | POA: Diagnosis not present

## 2023-12-06 DIAGNOSIS — X58XXXA Exposure to other specified factors, initial encounter: Secondary | ICD-10-CM | POA: Diagnosis not present

## 2023-12-13 DIAGNOSIS — S52501D Unspecified fracture of the lower end of right radius, subsequent encounter for closed fracture with routine healing: Secondary | ICD-10-CM | POA: Diagnosis not present

## 2023-12-19 DIAGNOSIS — M8580 Other specified disorders of bone density and structure, unspecified site: Secondary | ICD-10-CM | POA: Diagnosis not present

## 2023-12-19 DIAGNOSIS — S52501D Unspecified fracture of the lower end of right radius, subsequent encounter for closed fracture with routine healing: Secondary | ICD-10-CM | POA: Diagnosis not present

## 2023-12-19 DIAGNOSIS — Z79899 Other long term (current) drug therapy: Secondary | ICD-10-CM | POA: Diagnosis not present

## 2023-12-19 DIAGNOSIS — M0589 Other rheumatoid arthritis with rheumatoid factor of multiple sites: Secondary | ICD-10-CM | POA: Diagnosis not present

## 2024-01-02 DIAGNOSIS — S62101D Fracture of unspecified carpal bone, right wrist, subsequent encounter for fracture with routine healing: Secondary | ICD-10-CM | POA: Diagnosis not present

## 2024-01-09 DIAGNOSIS — M25631 Stiffness of right wrist, not elsewhere classified: Secondary | ICD-10-CM | POA: Diagnosis not present

## 2024-01-11 DIAGNOSIS — M79641 Pain in right hand: Secondary | ICD-10-CM | POA: Diagnosis not present

## 2024-01-11 DIAGNOSIS — M25631 Stiffness of right wrist, not elsewhere classified: Secondary | ICD-10-CM | POA: Diagnosis not present

## 2024-01-17 DIAGNOSIS — M79641 Pain in right hand: Secondary | ICD-10-CM | POA: Diagnosis not present

## 2024-01-17 DIAGNOSIS — M25631 Stiffness of right wrist, not elsewhere classified: Secondary | ICD-10-CM | POA: Diagnosis not present

## 2024-01-18 DIAGNOSIS — M0589 Other rheumatoid arthritis with rheumatoid factor of multiple sites: Secondary | ICD-10-CM | POA: Diagnosis not present

## 2024-01-19 DIAGNOSIS — M25631 Stiffness of right wrist, not elsewhere classified: Secondary | ICD-10-CM | POA: Diagnosis not present

## 2024-01-19 DIAGNOSIS — M79641 Pain in right hand: Secondary | ICD-10-CM | POA: Diagnosis not present

## 2024-01-26 DIAGNOSIS — M79641 Pain in right hand: Secondary | ICD-10-CM | POA: Diagnosis not present

## 2024-01-26 DIAGNOSIS — M25631 Stiffness of right wrist, not elsewhere classified: Secondary | ICD-10-CM | POA: Diagnosis not present

## 2024-01-30 DIAGNOSIS — M79641 Pain in right hand: Secondary | ICD-10-CM | POA: Diagnosis not present

## 2024-01-30 DIAGNOSIS — M25631 Stiffness of right wrist, not elsewhere classified: Secondary | ICD-10-CM | POA: Diagnosis not present

## 2024-02-02 DIAGNOSIS — M25631 Stiffness of right wrist, not elsewhere classified: Secondary | ICD-10-CM | POA: Diagnosis not present

## 2024-02-06 DIAGNOSIS — S62101D Fracture of unspecified carpal bone, right wrist, subsequent encounter for fracture with routine healing: Secondary | ICD-10-CM | POA: Diagnosis not present

## 2024-02-06 DIAGNOSIS — M25631 Stiffness of right wrist, not elsewhere classified: Secondary | ICD-10-CM | POA: Diagnosis not present

## 2024-02-06 DIAGNOSIS — M79641 Pain in right hand: Secondary | ICD-10-CM | POA: Diagnosis not present

## 2024-02-09 DIAGNOSIS — M25631 Stiffness of right wrist, not elsewhere classified: Secondary | ICD-10-CM | POA: Diagnosis not present

## 2024-02-09 DIAGNOSIS — M79641 Pain in right hand: Secondary | ICD-10-CM | POA: Diagnosis not present

## 2024-02-13 DIAGNOSIS — M79641 Pain in right hand: Secondary | ICD-10-CM | POA: Diagnosis not present

## 2024-02-13 DIAGNOSIS — M25631 Stiffness of right wrist, not elsewhere classified: Secondary | ICD-10-CM | POA: Diagnosis not present

## 2024-02-16 DIAGNOSIS — M0589 Other rheumatoid arthritis with rheumatoid factor of multiple sites: Secondary | ICD-10-CM | POA: Diagnosis not present

## 2024-02-17 DIAGNOSIS — M25631 Stiffness of right wrist, not elsewhere classified: Secondary | ICD-10-CM | POA: Diagnosis not present

## 2024-02-17 DIAGNOSIS — M79641 Pain in right hand: Secondary | ICD-10-CM | POA: Diagnosis not present

## 2024-02-20 DIAGNOSIS — M79641 Pain in right hand: Secondary | ICD-10-CM | POA: Diagnosis not present

## 2024-02-20 DIAGNOSIS — M25631 Stiffness of right wrist, not elsewhere classified: Secondary | ICD-10-CM | POA: Diagnosis not present

## 2024-02-24 DIAGNOSIS — M25631 Stiffness of right wrist, not elsewhere classified: Secondary | ICD-10-CM | POA: Diagnosis not present

## 2024-02-27 DIAGNOSIS — M25631 Stiffness of right wrist, not elsewhere classified: Secondary | ICD-10-CM | POA: Diagnosis not present

## 2024-02-27 DIAGNOSIS — M79641 Pain in right hand: Secondary | ICD-10-CM | POA: Diagnosis not present

## 2024-02-27 DIAGNOSIS — L578 Other skin changes due to chronic exposure to nonionizing radiation: Secondary | ICD-10-CM | POA: Diagnosis not present

## 2024-02-27 DIAGNOSIS — L814 Other melanin hyperpigmentation: Secondary | ICD-10-CM | POA: Diagnosis not present

## 2024-02-27 DIAGNOSIS — L570 Actinic keratosis: Secondary | ICD-10-CM | POA: Diagnosis not present

## 2024-02-27 DIAGNOSIS — L821 Other seborrheic keratosis: Secondary | ICD-10-CM | POA: Diagnosis not present

## 2024-03-02 DIAGNOSIS — M25631 Stiffness of right wrist, not elsewhere classified: Secondary | ICD-10-CM | POA: Diagnosis not present

## 2024-03-02 DIAGNOSIS — M79641 Pain in right hand: Secondary | ICD-10-CM | POA: Diagnosis not present

## 2024-03-05 DIAGNOSIS — M25631 Stiffness of right wrist, not elsewhere classified: Secondary | ICD-10-CM | POA: Diagnosis not present

## 2024-03-05 DIAGNOSIS — M79641 Pain in right hand: Secondary | ICD-10-CM | POA: Diagnosis not present

## 2024-03-09 DIAGNOSIS — M79641 Pain in right hand: Secondary | ICD-10-CM | POA: Diagnosis not present

## 2024-03-09 DIAGNOSIS — M25631 Stiffness of right wrist, not elsewhere classified: Secondary | ICD-10-CM | POA: Diagnosis not present

## 2024-03-15 DIAGNOSIS — M0589 Other rheumatoid arthritis with rheumatoid factor of multiple sites: Secondary | ICD-10-CM | POA: Diagnosis not present

## 2024-03-16 DIAGNOSIS — M79641 Pain in right hand: Secondary | ICD-10-CM | POA: Diagnosis not present

## 2024-03-16 DIAGNOSIS — M25631 Stiffness of right wrist, not elsewhere classified: Secondary | ICD-10-CM | POA: Diagnosis not present

## 2024-03-21 DIAGNOSIS — Z79899 Other long term (current) drug therapy: Secondary | ICD-10-CM | POA: Diagnosis not present

## 2024-03-21 DIAGNOSIS — M0589 Other rheumatoid arthritis with rheumatoid factor of multiple sites: Secondary | ICD-10-CM | POA: Diagnosis not present

## 2024-03-21 DIAGNOSIS — M8580 Other specified disorders of bone density and structure, unspecified site: Secondary | ICD-10-CM | POA: Diagnosis not present

## 2024-03-27 DIAGNOSIS — M25631 Stiffness of right wrist, not elsewhere classified: Secondary | ICD-10-CM | POA: Diagnosis not present

## 2024-03-27 DIAGNOSIS — M79641 Pain in right hand: Secondary | ICD-10-CM | POA: Diagnosis not present

## 2024-03-30 DIAGNOSIS — M25631 Stiffness of right wrist, not elsewhere classified: Secondary | ICD-10-CM | POA: Diagnosis not present

## 2024-03-30 DIAGNOSIS — M79641 Pain in right hand: Secondary | ICD-10-CM | POA: Diagnosis not present

## 2024-03-31 DIAGNOSIS — J069 Acute upper respiratory infection, unspecified: Secondary | ICD-10-CM | POA: Diagnosis not present

## 2024-04-09 DIAGNOSIS — R2232 Localized swelling, mass and lump, left upper limb: Secondary | ICD-10-CM | POA: Diagnosis not present

## 2024-04-09 DIAGNOSIS — S52501A Unspecified fracture of the lower end of right radius, initial encounter for closed fracture: Secondary | ICD-10-CM | POA: Diagnosis not present

## 2024-04-12 DIAGNOSIS — M0589 Other rheumatoid arthritis with rheumatoid factor of multiple sites: Secondary | ICD-10-CM | POA: Diagnosis not present

## 2024-04-13 DIAGNOSIS — M79641 Pain in right hand: Secondary | ICD-10-CM | POA: Diagnosis not present

## 2024-04-13 DIAGNOSIS — M25631 Stiffness of right wrist, not elsewhere classified: Secondary | ICD-10-CM | POA: Diagnosis not present

## 2024-04-16 ENCOUNTER — Other Ambulatory Visit: Payer: Self-pay | Admitting: General Surgery

## 2024-04-16 DIAGNOSIS — Z1231 Encounter for screening mammogram for malignant neoplasm of breast: Secondary | ICD-10-CM

## 2024-04-18 DIAGNOSIS — M79641 Pain in right hand: Secondary | ICD-10-CM | POA: Diagnosis not present

## 2024-04-18 DIAGNOSIS — M25631 Stiffness of right wrist, not elsewhere classified: Secondary | ICD-10-CM | POA: Diagnosis not present

## 2024-04-27 DIAGNOSIS — M79641 Pain in right hand: Secondary | ICD-10-CM | POA: Diagnosis not present

## 2024-04-27 DIAGNOSIS — M25631 Stiffness of right wrist, not elsewhere classified: Secondary | ICD-10-CM | POA: Diagnosis not present

## 2024-05-04 DIAGNOSIS — M79641 Pain in right hand: Secondary | ICD-10-CM | POA: Diagnosis not present

## 2024-05-04 DIAGNOSIS — M25631 Stiffness of right wrist, not elsewhere classified: Secondary | ICD-10-CM | POA: Diagnosis not present

## 2024-06-18 ENCOUNTER — Encounter

## 2024-06-27 ENCOUNTER — Encounter
